# Patient Record
Sex: Female | Born: 1985 | Race: White | Hispanic: No | Smoking: Never smoker
Health system: Southern US, Community
[De-identification: ages and names within clinical notes are randomized; demographics above are authoritative.]

## PROBLEM LIST (undated history)

## (undated) DIAGNOSIS — E119 Type 2 diabetes mellitus without complications: Secondary | ICD-10-CM

## (undated) DIAGNOSIS — D75839 Thrombocytosis, unspecified: Secondary | ICD-10-CM

## (undated) DIAGNOSIS — D473 Essential (hemorrhagic) thrombocythemia: Secondary | ICD-10-CM

## (undated) DIAGNOSIS — I10 Essential (primary) hypertension: Secondary | ICD-10-CM

---
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
## 2019-01-08 DIAGNOSIS — R7989 Other specified abnormal findings of blood chemistry: Secondary | ICD-10-CM

## 2019-02-07 DIAGNOSIS — R7989 Other specified abnormal findings of blood chemistry: Secondary | ICD-10-CM

## 2019-04-30 DIAGNOSIS — U071 COVID-19: Secondary | ICD-10-CM | POA: Diagnosis present

## 2019-05-01 ENCOUNTER — Encounter (HOSPITAL_COMMUNITY): Payer: Self-pay | Admitting: Internal Medicine

## 2019-05-01 ENCOUNTER — Inpatient Hospital Stay (HOSPITAL_COMMUNITY): Payer: Commercial Managed Care - PPO

## 2019-05-01 ENCOUNTER — Other Ambulatory Visit: Payer: Self-pay

## 2019-05-01 ENCOUNTER — Inpatient Hospital Stay (HOSPITAL_COMMUNITY)
Admission: AD | Admit: 2019-05-01 | Discharge: 2019-05-05 | DRG: 177 | Disposition: A | Discharge status: Home / Self Care | Payer: Commercial Managed Care - PPO | Source: Other Acute Inpatient Hospital | Attending: Internal Medicine | Admitting: Internal Medicine

## 2019-05-01 DIAGNOSIS — T380X5A Adverse effect of glucocorticoids and synthetic analogues, initial encounter: Secondary | ICD-10-CM | POA: Diagnosis present

## 2019-05-01 DIAGNOSIS — Z6841 Body Mass Index (BMI) 40.0 and over, adult: Secondary | ICD-10-CM

## 2019-05-01 DIAGNOSIS — J9601 Acute respiratory failure with hypoxia: Secondary | ICD-10-CM | POA: Diagnosis present

## 2019-05-01 DIAGNOSIS — Z833 Family history of diabetes mellitus: Secondary | ICD-10-CM

## 2019-05-01 DIAGNOSIS — E669 Obesity, unspecified: Secondary | ICD-10-CM | POA: Diagnosis present

## 2019-05-01 DIAGNOSIS — D75839 Thrombocytosis, unspecified: Secondary | ICD-10-CM | POA: Diagnosis present

## 2019-05-01 DIAGNOSIS — E119 Type 2 diabetes mellitus without complications: Secondary | ICD-10-CM

## 2019-05-01 DIAGNOSIS — Z8249 Family history of ischemic heart disease and other diseases of the circulatory system: Secondary | ICD-10-CM | POA: Diagnosis not present

## 2019-05-01 DIAGNOSIS — I10 Essential (primary) hypertension: Secondary | ICD-10-CM | POA: Diagnosis present

## 2019-05-01 DIAGNOSIS — U071 COVID-19: Secondary | ICD-10-CM | POA: Diagnosis present

## 2019-05-01 DIAGNOSIS — E1169 Type 2 diabetes mellitus with other specified complication: Secondary | ICD-10-CM | POA: Diagnosis not present

## 2019-05-01 DIAGNOSIS — J1289 Other viral pneumonia: Secondary | ICD-10-CM | POA: Diagnosis present

## 2019-05-01 DIAGNOSIS — Z7982 Long term (current) use of aspirin: Secondary | ICD-10-CM

## 2019-05-01 DIAGNOSIS — R0602 Shortness of breath: Secondary | ICD-10-CM

## 2019-05-01 DIAGNOSIS — D473 Essential (hemorrhagic) thrombocythemia: Secondary | ICD-10-CM | POA: Diagnosis present

## 2019-05-01 DIAGNOSIS — E1165 Type 2 diabetes mellitus with hyperglycemia: Secondary | ICD-10-CM | POA: Diagnosis not present

## 2019-05-01 DIAGNOSIS — Z794 Long term (current) use of insulin: Secondary | ICD-10-CM | POA: Diagnosis not present

## 2019-05-01 HISTORY — DX: Thrombocytosis, unspecified: D75.839

## 2019-05-01 HISTORY — DX: Type 2 diabetes mellitus without complications: E11.9

## 2019-05-01 HISTORY — DX: Essential (primary) hypertension: I10

## 2019-05-01 LAB — COMPREHENSIVE METABOLIC PANEL
ALT: 22 U/L (ref 0–44)
AST: 15 U/L (ref 15–41)
Albumin: 3.4 g/dL — ABNORMAL LOW (ref 3.5–5.0)
Alkaline Phosphatase: 35 U/L — ABNORMAL LOW (ref 38–126)
Anion gap: 15 (ref 5–15)
BUN: 14 mg/dL (ref 6–20)
CO2: 25 mmol/L (ref 22–32)
Calcium: 8.7 mg/dL — ABNORMAL LOW (ref 8.9–10.3)
Chloride: 95 mmol/L — ABNORMAL LOW (ref 98–111)
Creatinine, Ser: 0.74 mg/dL (ref 0.44–1.00)
GFR calc Af Amer: >60 mL/min (ref >60)
GFR calc non Af Amer: >60 mL/min (ref >60)
Glucose, Bld: 318 mg/dL — ABNORMAL HIGH (ref 70–99)
Potassium: 3.8 mmol/L (ref 3.5–5.1)
Sodium: 135 mmol/L (ref 135–145)
Total Bilirubin: 0.7 mg/dL (ref 0.3–1.2)
Total Protein: 7.1 g/dL (ref 6.5–8.1)

## 2019-05-01 LAB — GLUCOSE, CAPILLARY
Glucose-Capillary: 244 mg/dL — ABNORMAL HIGH (ref 70–99)
Glucose-Capillary: 327 mg/dL — ABNORMAL HIGH (ref 70–99)
Glucose-Capillary: 323 mg/dL — ABNORMAL HIGH (ref 70–99)
Glucose-Capillary: 315 mg/dL — ABNORMAL HIGH (ref 70–99)
Glucose-Capillary: 354 mg/dL — ABNORMAL HIGH (ref 70–99)

## 2019-05-01 LAB — CBC WITH DIFFERENTIAL/PLATELET
Abs Immature Granulocytes: 0.03 10*3/uL (ref 0.00–0.07)
Basophils Absolute: 0 10*3/uL (ref 0.0–0.1)
Basophils Relative: 0 %
Eosinophils Absolute: 0 10*3/uL (ref 0.0–0.5)
Eosinophils Relative: 0 %
HCT: 36.1 % (ref 36.0–46.0)
Hemoglobin: 11.4 g/dL — ABNORMAL LOW (ref 12.0–15.0)
Immature Granulocytes: 1 %
Lymphocytes Relative: 14 %
Lymphs Abs: 0.7 10*3/uL (ref 0.7–4.0)
MCH: 26.9 pg (ref 26.0–34.0)
MCHC: 31.6 g/dL (ref 30.0–36.0)
MCV: 85.1 fL (ref 80.0–100.0)
Monocytes Absolute: 0.3 10*3/uL (ref 0.1–1.0)
Monocytes Relative: 6 %
Neutro Abs: 3.6 10*3/uL (ref 1.7–7.7)
Neutrophils Relative %: 79 %
Platelets: 441 10*3/uL — ABNORMAL HIGH (ref 150–400)
RBC: 4.24 MIL/uL (ref 3.87–5.11)
RDW: 12.6 % (ref 11.5–15.5)
WBC: 4.6 10*3/uL (ref 4.0–10.5)
nRBC: 0 % (ref 0.0–0.2)

## 2019-05-01 LAB — C-REACTIVE PROTEIN: CRP: 21.2 mg/dL — ABNORMAL HIGH (ref <1.0)

## 2019-05-01 LAB — D-DIMER, QUANTITATIVE: D-Dimer, Quant: 0.93 ug/mL-FEU — ABNORMAL HIGH (ref 0.00–0.50)

## 2019-05-01 LAB — HEMOGLOBIN A1C
Hgb A1c MFr Bld: 9 % — ABNORMAL HIGH (ref 4.8–5.6)
Mean Plasma Glucose: 211.6 mg/dL

## 2019-05-01 LAB — ABO/RH: ABO/RH(D): O NEG

## 2019-05-01 LAB — HIV ANTIBODY (ROUTINE TESTING W REFLEX): HIV Screen 4th Generation wRfx: NONREACTIVE

## 2019-05-01 LAB — PROCALCITONIN: Procalcitonin: <0.1 ng/mL

## 2019-05-01 LAB — LACTATE DEHYDROGENASE: LDH: 235 U/L — ABNORMAL HIGH (ref 98–192)

## 2019-05-01 MED ORDER — SODIUM CHLORIDE 0.9 % IV SOLN
100.0000 mg | INTRAVENOUS | Status: AC
  Administered 2019-05-02 – 2019-05-05 (×4): 100 mg via INTRAVENOUS
  Filled 2019-05-01 (×4): qty 20

## 2019-05-01 MED ORDER — ENSURE ENLIVE PO LIQD: 237.0000 mL | Freq: Two times a day (BID) | ORAL | Status: DC

## 2019-05-01 MED ORDER — ENOXAPARIN SODIUM 60 MG/0.6ML ~~LOC~~ SOLN
50.0000 mg | SUBCUTANEOUS | Status: DC
  Administered 2019-05-02 – 2019-05-05 (×4): 50 mg via SUBCUTANEOUS
  Filled 2019-05-01 (×4): qty 0.6

## 2019-05-01 MED ORDER — CLONIDINE HCL 0.1 MG PO TABS
0.1000 mg | ORAL_TABLET | Freq: Four times a day (QID) | ORAL | Status: DC | PRN
  Administered 2019-05-01: 0.1 mg via ORAL
  Filled 2019-05-01: qty 1

## 2019-05-01 MED ORDER — SODIUM CHLORIDE 0.9 % IV SOLN
200.0000 mg | Freq: Once | INTRAVENOUS | Status: AC
  Administered 2019-05-01: 200 mg via INTRAVENOUS
  Filled 2019-05-01: qty 40

## 2019-05-01 MED ORDER — ORAL CARE MOUTH RINSE
15.0000 mL | Freq: Two times a day (BID) | OROMUCOSAL | Status: DC
  Administered 2019-05-01 – 2019-05-05 (×8): 15 mL via OROMUCOSAL

## 2019-05-01 MED ORDER — SODIUM CHLORIDE 0.9% FLUSH
3.0000 mL | Freq: Two times a day (BID) | INTRAVENOUS | Status: DC
  Administered 2019-05-01 – 2019-05-05 (×10): 3 mL via INTRAVENOUS

## 2019-05-01 MED ORDER — ACETAMINOPHEN 325 MG PO TABS
650.0000 mg | ORAL_TABLET | Freq: Four times a day (QID) | ORAL | Status: DC | PRN
  Administered 2019-05-01 – 2019-05-04 (×3): 650 mg via ORAL
  Filled 2019-05-01 (×3): qty 2

## 2019-05-01 MED ORDER — ENOXAPARIN SODIUM 40 MG/0.4ML ~~LOC~~ SOLN
40.0000 mg | SUBCUTANEOUS | Status: DC
  Administered 2019-05-01: 40 mg via SUBCUTANEOUS
  Filled 2019-05-01: qty 0.4

## 2019-05-01 MED ORDER — POLYETHYLENE GLYCOL 3350 17 G PO PACK: 17.0000 g | PACK | Freq: Every day | ORAL | Status: DC | PRN

## 2019-05-01 MED ORDER — INSULIN GLARGINE 100 UNIT/ML ~~LOC~~ SOLN
15.0000 [IU] | Freq: Every day | SUBCUTANEOUS | Status: DC
  Administered 2019-05-01: 15 [IU] via SUBCUTANEOUS
  Filled 2019-05-01 (×2): qty 0.15

## 2019-05-01 MED ORDER — METHYLPREDNISOLONE SODIUM SUCC 40 MG IJ SOLR: 40.0000 mg | Freq: Two times a day (BID) | INTRAMUSCULAR | Status: DC

## 2019-05-01 MED ORDER — INSULIN ASPART 100 UNIT/ML ~~LOC~~ SOLN
0.0000 [IU] | Freq: Three times a day (TID) | SUBCUTANEOUS | Status: DC
  Administered 2019-05-01 – 2019-05-02 (×5): 15 [IU] via SUBCUTANEOUS
  Administered 2019-05-02 – 2019-05-03 (×2): 11 [IU] via SUBCUTANEOUS
  Administered 2019-05-03: 20 [IU] via SUBCUTANEOUS
  Administered 2019-05-03: 15 [IU] via SUBCUTANEOUS
  Administered 2019-05-04: 11 [IU] via SUBCUTANEOUS
  Administered 2019-05-04: 20 [IU] via SUBCUTANEOUS
  Administered 2019-05-04: 15 [IU] via SUBCUTANEOUS
  Administered 2019-05-05: 7 [IU] via SUBCUTANEOUS
  Administered 2019-05-05: 4 [IU] via SUBCUTANEOUS

## 2019-05-01 MED ORDER — TRAMADOL HCL 50 MG PO TABS
50.0000 mg | ORAL_TABLET | Freq: Four times a day (QID) | ORAL | Status: DC | PRN
  Administered 2019-05-04 – 2019-05-05 (×2): 50 mg via ORAL
  Filled 2019-05-01 (×2): qty 1

## 2019-05-01 MED ORDER — AMLODIPINE BESYLATE 5 MG PO TABS
5.0000 mg | ORAL_TABLET | Freq: Every day | ORAL | Status: DC
  Administered 2019-05-01 – 2019-05-04 (×4): 5 mg via ORAL
  Filled 2019-05-01 (×5): qty 1

## 2019-05-01 MED ORDER — INSULIN ASPART 100 UNIT/ML ~~LOC~~ SOLN
0.0000 [IU] | Freq: Every day | SUBCUTANEOUS | Status: DC
  Administered 2019-05-01: 5 [IU] via SUBCUTANEOUS
  Administered 2019-05-02 – 2019-05-03 (×2): 3 [IU] via SUBCUTANEOUS
  Administered 2019-05-04: 4 [IU] via SUBCUTANEOUS

## 2019-05-01 MED ORDER — SODIUM CHLORIDE 0.9 % IV SOLN: 250.0000 mL | INTRAVENOUS | Status: DC | PRN

## 2019-05-01 MED ORDER — ONDANSETRON HCL 4 MG PO TABS: 4.0000 mg | ORAL_TABLET | Freq: Four times a day (QID) | ORAL | Status: DC | PRN

## 2019-05-01 MED ORDER — ASPIRIN EC 81 MG PO TBEC
81.0000 mg | DELAYED_RELEASE_TABLET | Freq: Every day | ORAL | Status: DC
  Administered 2019-05-01 – 2019-05-04 (×4): 81 mg via ORAL
  Filled 2019-05-01 (×5): qty 1

## 2019-05-01 MED ORDER — BENZONATATE 100 MG PO CAPS: 200.0000 mg | ORAL_CAPSULE | Freq: Three times a day (TID) | ORAL | Status: DC | PRN

## 2019-05-01 MED ORDER — METHYLPREDNISOLONE SODIUM SUCC 40 MG IJ SOLR
40.0000 mg | Freq: Two times a day (BID) | INTRAMUSCULAR | Status: DC
  Administered 2019-05-01 – 2019-05-04 (×8): 40 mg via INTRAVENOUS
  Filled 2019-05-01 (×8): qty 1

## 2019-05-01 MED ORDER — ALBUTEROL SULFATE HFA 108 (90 BASE) MCG/ACT IN AERS
2.0000 | INHALATION_SPRAY | RESPIRATORY_TRACT | Status: DC | PRN
  Administered 2019-05-01: 2 via RESPIRATORY_TRACT
  Filled 2019-05-01: qty 6.7

## 2019-05-01 MED ORDER — METHYLPREDNISOLONE SODIUM SUCC 500 MG IJ SOLR: 1.0000 mg/kg/d | INTRAMUSCULAR | Status: DC

## 2019-05-01 MED ORDER — ONDANSETRON HCL 4 MG/2ML IJ SOLN: 4.0000 mg | Freq: Four times a day (QID) | INTRAMUSCULAR | Status: DC | PRN

## 2019-05-01 MED ORDER — SODIUM CHLORIDE 0.9% FLUSH: 3.0000 mL | INTRAVENOUS | Status: DC | PRN

## 2019-05-01 NOTE — Assessment & Plan Note (Signed)
Admit to progressive care bed. Continue with supplemental O2.

## 2019-05-01 NOTE — Assessment & Plan Note (Signed)
Continue norvasc. Add prn clonidine. IV steroids may increase her BP.

## 2019-05-01 NOTE — Progress Notes (Signed)
Patient's mother, Juliann Pulse [phone], provided daily updates. Call lasted approximately 15 minutes. Questions encouraged and patient's mother voiced her appreciation of nurses call.

## 2019-05-01 NOTE — Progress Notes (Signed)
PROGRESS NOTE                                                                                                                                                                                                             Patient Demographics:    Courtney Barrera, is a 33 y.o. female, DOB - [birth year], HUD:[id number]  Outpatient Primary MD for the patient is Patient, No Pcp Per    LOS - 0  No chief complaint on file.      Brief Narrative: Patient is a 33 y.o. female with PMHx of HTN, DM-2, obesity-exposed to her boyfriend who was COVID 19+-presented with approximately 10-day history of fever, cough, worsening shortness of breath-found to have acute hypoxemic respiratory failure secondary to COVID-19 viral pneumonia.  See below for further details   Subjective:    Courtney Barrera today feels somewhat better.  Thinks her shortness of breath has improved compared to yesterday.  Still coughing.  No other complaints.   Assessment  & Plan :   Acute Hypoxic Resp Failure due to Covid 19 Viral pneumonia: Somewhat improved-I was able to titrate her oxygen down to around 3.5 L while I was in the room this morning-she maintained O2 saturation in the low 90s.  She appears very comfortable.  At this point plan is to continue with steroids/Remdesivir-and follow inflammatory markers and clinical course.  I have encouraged patient to keep prone for at least patient is s/p Actemra on 7/21 at [facility].  If her oxygen requirements worsen-we could contemplate a second dosing of Actemra and convalescent plasma.  COVID-19 Labs:  Recent Labs    05/01/19 0750  DDIMER 0.93*  LDH 235*  CRP 21.2*    No results found for: SARSCOV2NAA   COVID-19 Medications: 7/21>> Actemra at [facility] 7/21>> Solu-Medrol 7/22>> Remdesivir  Uncontrolled DM-2 with hyperglycemia (A1c 9.0 on 7/22): CBGs more than 300 this morning-anticipate some  amount of hyperglycemia due to steroids-continue SSI-add 15 units of Lantus and follow.  HTN: Controlled-continue amlodipine  ABG: No results found for: PHART, PCO2ART, PO2ART, HCO3, TCO2, ACIDBASEDEF, O2SAT  Condition - Extremely Guarded/Stable  Family Communication  : Patient will update mother and spouse herself.  I have asked her to let me know if I need to talk to her family as well.  Code Status :  Full Code  Diet :  Diet Order            Diet regular Room service appropriate? Yes; Fluid consistency: Thin  Diet effective now               Disposition Plan  :  Remain inpatient  Consults  :  PCCM  Procedures  :  None  DVT Prophylaxis  :  Lovenox   Lab Results  Component Value Date   PLT 441 (H) 05/01/2019    Inpatient Medications  Scheduled Meds: . amLODipine  5 mg Oral Daily  . aspirin EC  81 mg Oral Daily  . [START ON 05/02/2019] enoxaparin (LOVENOX) injection  50 mg Subcutaneous Q24H  . insulin aspart  0-20 Units Subcutaneous TID WC  . insulin aspart  0-5 Units Subcutaneous QHS  . mouth rinse  15 mL Mouth Rinse BID  . methylPREDNISolone (SOLU-MEDROL) injection  40 mg Intravenous BID  . sodium chloride flush  3 mL Intravenous Q12H   Continuous Infusions: . sodium chloride    . [START ON 05/02/2019] remdesivir 100 mg in NS 250 mL     PRN Meds:.sodium chloride, acetaminophen, cloNIDine, ondansetron **OR** ondansetron (ZOFRAN) IV, sodium chloride flush, traMADol  Antibiotics  :    Anti-infectives (From admission, onward)   Start     Dose/Rate Route Frequency Ordered Stop   05/02/19 1030  remdesivir 100 mg in sodium chloride 0.9 % 250 mL IVPB     100 mg 500 mL/hr over 30 Minutes Intravenous Every 24 hours 05/01/19 0924 05/06/19 1029   05/01/19 1030  remdesivir 200 mg in sodium chloride 0.9 % 250 mL IVPB     200 mg 500 mL/hr over 30 Minutes Intravenous Once 05/01/19 0086 05/01/19 1107       Time Spent in minutes  25   Oren Binet M.D on  05/01/2019 at 11:21 AM  To page go to [email or web address] - use universal password  [facility] -  Office  [phone]  See all Orders from today for further details   Admit date - 05/01/2019    0    Objective:   Vitals:   05/01/19 0427 05/01/19 0429 05/01/19 0552 05/01/19 0729  BP:  (!) 127/104  123/88  Pulse:  (!) 104 95 97  Resp:  (!) 27 (!) 21 (!) 27  Temp: 99.1 F (37.3 C)   97.7 F (36.5 C)  TempSrc: Oral   Oral  SpO2:  90% 96% 92%  Weight:      Height:        Wt Readings from Last 3 Encounters:  05/01/19 97.2 kg     Intake/Output Summary (Last 24 hours) at 05/01/2019 1121 Last data filed at 05/01/2019 1037 Gross per 24 hour  Intake 843 ml  Output 975 ml  Net -132 ml     Physical Exam Gen Exam:Alert awake-not in any distress HEENT:atraumatic, normocephalic Chest: B/L clear to auscultation anteriorly CVS:S1S2 regular Abdomen:soft non tender, non distended Extremities:no edema Neurology: Non focal Skin: no rash   Data Review:    CBC Recent Labs  Lab 05/01/19 0750  WBC 4.6  HGB 11.4*  HCT 36.1  PLT 441*  MCV 85.1  MCH 26.9  MCHC 31.6  RDW 12.6  LYMPHSABS 0.7  MONOABS 0.3  EOSABS 0.0  BASOSABS 0.0    Chemistries  Recent Labs  Lab 05/01/19 0750  NA 135  K 3.8  CL 95*  CO2 25  GLUCOSE 318*  BUN 14  CREATININE 0.74  CALCIUM 8.7*  AST 15  ALT 22  ALKPHOS 35*  BILITOT 0.7   ------------------------------------------------------------------------------------------------------------------ No results for input(s): CHOL, HDL, LDLCALC, TRIG, CHOLHDL, LDLDIRECT in the last 72 hours.  Lab Results  Component Value Date   HGBA1C 9.0 (H) 05/01/2019   ------------------------------------------------------------------------------------------------------------------ No results for input(s): TSH, T4TOTAL, T3FREE, THYROIDAB in the last 72 hours.  Invalid input(s): FREET3  ------------------------------------------------------------------------------------------------------------------ No results for input(s): VITAMINB12, FOLATE, FERRITIN, TIBC, IRON, RETICCTPCT in the last 72 hours.  Coagulation profile No results for input(s): INR, PROTIME in the last 168 hours.  Recent Labs    05/01/19 0750  DDIMER 0.93*    Cardiac Enzymes No results for input(s): CKMB, TROPONINI, MYOGLOBIN in the last 168 hours.  Invalid input(s): CK ------------------------------------------------------------------------------------------------------------------ No results found for: BNP  Micro Results No results found for this or any previous visit (from the past 240 hour(s)).  Radiology Reports Portable Chest 1 View  Result Date: 05/01/2019 CLINICAL DATA:  Follow-up viral pneumonia. EXAM: PORTABLE CHEST 1 VIEW COMPARISON:  04/30/2019 FINDINGS: Widespread patchy bilateral pulmonary infiltrates persist, with radiographic improvement. No dense consolidation, collapse or effusion. No worsening or new finding. IMPRESSION: Persistent patchy bilateral pulmonary infiltrates, improved since yesterday. Electronically Signed   By: Nelson Chimes M.D.   On: 05/01/2019 10:03

## 2019-05-01 NOTE — Assessment & Plan Note (Signed)
Pt received Actemra at [facility]. Will continue with Solumedrol at 1 mg/kg daily.  Consider repeat dosing of Actemra is no improvement in 24 hours.  DVT prophylaxis with lovenox.

## 2019-05-01 NOTE — Subjective & Objective (Signed)
CC: fever, chills, positive Covid-95 HPI: 33 year old female with a history of type 2 diabetes, essential hypertension, obesity presents to the [facility] as a transfer from [facility].  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  She has had one encounter with a COVID positive patient.  Her boyfriend also works at [employer] has been in contact with COVID positive patients.  On April 21, 2019, the patient felt like she was having a head cold.  She started having a stuffy nose and a bit of nasal drainage.  3 days later by Wednesday, July 15, she began having nausea, abdominal pain.  She noted taste and smell changes.  She started having fever and chills.  She went to [facility] to have [facility] COVID-19 test performed.  She still not have results today.  She started to feel worse.  Her boyfriend tested positive for COVID.  She went to [facility] ER.  She had a rapid COVID-19 test that was positive.  Her shortness of breath did increase.  She was noted to be hypoxic with O2 saturations less than 90%.  Patient states that for the last 3 days she has been checking her pulse oximetry at home and it has been less than 90%.  Her O2 requirements increased in the ER.  Initially she was on 2 L but quickly increased to 4 L.  She was given a dose of IV Solu-Medrol and a dose of Actemra.  Patient was transferred to [place] for further management.  Patient states that she is feeling better.  Her breathing is still somewhat short but has improved since initiation of therapy.  She is no longer having any fevers.

## 2019-05-01 NOTE — H&P (Signed)
History and Physical    Courtney Barrera VHQ:[id number] DOB: August 03, 1986 DOA: 05/01/2019  PCP: Patient, No Pcp Per   Patient coming from: [facility]  I have personally briefly reviewed patient's old medical records in [place]  CC: fever, chills, positive Covid-76 HPI: 33 year old female with a history of type 2 diabetes, essential hypertension, obesity presents to the [facility] as a transfer from [facility].  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  She has had one encounter with a COVID positive patient.  Her boyfriend also works at [employer] has been in contact with COVID positive patients.  On April 21, 2019, the patient felt like she was having a head cold.  She started having a stuffy nose and a bit of nasal drainage.  3 days later by Wednesday, July 15, she began having nausea, abdominal pain.  She noted taste and smell changes.  She started having fever and chills.  [facility].  She still not have results today.  She started to feel worse.  Her boyfriend tested positive for COVID.  She went to [facility] ER.  She had a rapid COVID-19 test that was positive.  Her shortness of breath did increase.  She was noted to be hypoxic with O2 saturations less than 90%.  Patient states that for the last 3 days she has been checking her pulse oximetry at home and it has been less than 90%.  Her O2 requirements increased in the ER.  Initially she was on 2 L but quickly increased to 4 L.  She was given a dose of IV Solu-Medrol and a dose of Actemra.  Patient was transferred to [facility] for further management.  Patient states that she is feeling better.  Her breathing is still somewhat short but has improved since initiation of therapy.  She is no longer having any fevers.    ED Course: pt given IV solumedrol, Actemra and started on supplemental O2.  Review of  Systems:  Review of Systems  Constitutional: Positive for chills, fever and malaise/fatigue.  HENT:       Taste and smell changes  Eyes: Negative.   Respiratory: Positive for cough and shortness of breath.   Cardiovascular: Negative.   Gastrointestinal: Positive for abdominal pain and diarrhea.  Genitourinary: Negative.   Musculoskeletal: Positive for myalgias.  Skin: Negative.   Neurological: Positive for dizziness.  Endo/Heme/Allergies: Negative.   All other systems reviewed and are negative.   Past Medical History:  Diagnosis Date  . Benign essential HTN   . DM (diabetes mellitus), type 2 ([place])   . Thrombocytosis ([place])     History reviewed. No pertinent surgical history.   reports that she has never smoked. She has never used smokeless tobacco. She reports previous alcohol use. She reports that she does not use drugs.  Allergies  Allergen Reactions  . Codeine Nausea And Vomiting    Family History  Problem Relation Age of Onset  . Diabetes Mother   . Hypertension Mother   . Diabetes Father   . Hypertension Father    Home Meds: asa 81 mg daily Metformin 1000 mg bid norvasc 5 mg daily Trulicity  Physical Exam: Vitals:   05/01/19 0143 05/01/19 0213  BP: 128/87   Pulse: (!) 106   Resp: (!) 22   Temp: 98.8 F (37.1 C)   TempSrc: Oral   SpO2:  91%   Weight:  97.2 kg  Height:  4\' 11"  (1.499 m)    Physical Exam  Nursing note and vitals reviewed. Constitutional: She is oriented to person, place, and time. She appears well-developed and well-nourished. No distress. She is not intubated.  HENT:  Head: Normocephalic and atraumatic.  Nose: Nose normal.  Eyes: Right eye exhibits no discharge. Left eye exhibits no discharge.  Neck: No thyromegaly present.  Cardiovascular: Regular rhythm, S1 normal, S2 normal, normal heart sounds and normal pulses. Tachycardia present. PMI is not displaced.  Respiratory: No accessory muscle usage. No bradypnea. She is not  intubated. No respiratory distress. She has rales in the right upper field and the left upper field.  GI: Soft. Bowel sounds are normal. She exhibits no distension. There is no abdominal tenderness. There is no rebound.  Musculoskeletal:        General: No deformity or edema.  Neurological: She is alert and oriented to person, place, and time.  Skin: Skin is warm and dry. She is not diaphoretic.  Psychiatric: She has a normal mood and affect. Her behavior is normal. Judgment and thought content normal.      Labs on Admission: I have personally reviewed following labs and imaging studies  CBC: No results for input(s): WBC, NEUTROABS, HGB, HCT, MCV, PLT in the last 168 hours. Basic Metabolic Panel: No results for input(s): NA, K, CL, CO2, GLUCOSE, BUN, CREATININE, CALCIUM, MG, PHOS in the last 168 hours. GFR: CrCl cannot be calculated (No successful lab value found.). Liver Function Tests: No results for input(s): AST, ALT, ALKPHOS, BILITOT, PROT, ALBUMIN in the last 168 hours. No results for input(s): LIPASE, AMYLASE in the last 168 hours. No results for input(s): AMMONIA in the last 168 hours. Coagulation Profile: No results for input(s): INR, PROTIME in the last 168 hours. Cardiac Enzymes: No results for input(s): CKTOTAL, CKMB, CKMBINDEX, TROPONINI in the last 168 hours. BNP (last 3 results) No results for input(s): PROBNP in the last 8760 hours. HbA1C: No results for input(s): HGBA1C in the last 72 hours. CBG: No results for input(s): GLUCAP in the last 168 hours. Lipid Profile: No results for input(s): CHOL, HDL, LDLCALC, TRIG, CHOLHDL, LDLDIRECT in the last 72 hours. Thyroid Function Tests: No results for input(s): TSH, T4TOTAL, FREET4, T3FREE, THYROIDAB in the last 72 hours. Anemia Panel: No results for input(s): VITAMINB12, FOLATE, FERRITIN, TIBC, IRON, RETICCTPCT in the last 72 hours. Urine analysis: No results found for: COLORURINE, APPEARANCEUR, LABSPEC, PHURINE,  GLUCOSEU, [place], BILIRUBINUR, KETONESUR, PROTEINUR, UROBILINOGEN, NITRITE, LEUKOCYTESUR  Radiological Exams on Admission: I have personally reviewed images No results found.  EKG:no ekg to review  Assessment/Plan Principal Problem:   Acute respiratory failure with hypoxia (HCC) Active Problems:   COVID-19 virus infection   Thrombocytosis (HCC)   DM (diabetes mellitus), type 2 (HCC)   Benign essential HTN    Acute on chronic respiratory failure with hypoxia ([place]) Admit to progressive care care. Continue with supplemental O2. Repeat labs in AM.  COVID-19 virus infection Pt received Actemra at [facility]. Will continue with Solumedrol at 1 mg/kg daily.  Consider repeat dosing of Actemra is no improvement in 24 hours.  DVT prophylaxis with lovenox.  Acute respiratory failure with hypoxia (HCC) Admit to progressive care bed. Continue with supplemental O2.  Thrombocytosis (HCC) Continue ASA 81 mg daily  DM (diabetes mellitus), type 2 (HCC) Check HgA1C.  Hold metformin and Trulicity while inpatient.  Add sliding scale insulin. May need lantus due to IV  steroids.  Benign essential HTN Continue norvasc. Add prn clonidine. IV steroids may increase her BP.   DVT prophylaxis: Lovenox Code Status: Full Code Family Communication: patient has named her mother Michel Harrow as her healthcare champion  Disposition Plan: to home in 3-5 days  Consults called: none  Admission status: Inpatient, progressive care   Kristopher Oppenheim, DO [facility] 05/01/2019, 2:47 AM

## 2019-05-01 NOTE — Progress Notes (Signed)
Pharmacy Antibiotic Note  Courtney Barrera is a 33 y.o. female admitted on 05/01/2019 with COVID 19.  Pharmacy has been consulted for Remdesivir dosing.  CXR shows PNA at [facility] Pt requiring 5L [place] ALT 22  Plan: Remdesivir 200mg  IV now then 100mg  IV daily x 4 days Will f/u ALT and pt's clinical condition   Height: 4\' 11"  (149.9 cm) Weight: 214 lb 3.2 oz (97.2 kg) IBW/kg (Calculated) : 43.2  Temp (24hrs), Avg:98.5 F (36.9 C), Min:97.7 F (36.5 C), Max:99.1 F (37.3 C)  Recent Labs  Lab 05/01/19 0750  WBC 4.6  CREATININE 0.74    Estimated Creatinine Clearance: 101.4 mL/min (by C-G formula based on SCr of 0.74 mg/dL).    Allergies  Allergen Reactions  . Codeine Nausea And Vomiting    Also reports dizziness    Antimicrobials this admission: 7/22 Remdesivir >> 7/26  Thank you for allowing pharmacy to be a part of this patient's care.  Sherlon Handing, PharmD, BCPS CGV Clinical pharmacist phone [phone] 05/01/2019 9:15 AM

## 2019-05-01 NOTE — Assessment & Plan Note (Signed)
Admit to progressive care care. Continue with supplemental O2. Repeat labs in AM.

## 2019-05-01 NOTE — Progress Notes (Signed)
Spoke with patient's mother, Juliann Pulse, per patient's request and provided updates on patient. She really appreciated the call and the care.

## 2019-05-01 NOTE — Assessment & Plan Note (Signed)
- 

## 2019-05-01 NOTE — Progress Notes (Signed)
Initial Nutrition Assessment  DOCUMENTATION CODES:   Morbid obesity  INTERVENTION:   Pt receiving Magic cup BID with lunch and dinner, each supplement provides 290 kcal and 9 grams of protein, automatically on meal trays to optimize nutritional intake.   Recommend outpatient DM education after discharge.    NUTRITION DIAGNOSIS:   Increased nutrient needs related to acute illness(COVID-19) as evidenced by estimated needs.  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 Her boyfriend works at [employer] he recently tested positive. Pt began to have nausea and abd pain 7/15 and became more SOB 7/22 and was then admitted with COVID.   Pt on 5L [place] Per hemoglobin A1C pt with poor control of her DM as an outpatient.  Meal Completion: 100%  Medications reviewed and include: SSI, solumedrol Labs reviewed:  CBG: 327 (H) Lab Results  Component Value Date   HGBA1C 9.0 (H) 05/01/2019     NUTRITION - FOCUSED PHYSICAL EXAM:  Deferred   Diet Order:   Diet Order            Diet regular Room service appropriate? Yes; Fluid consistency: Thin  Diet effective now              EDUCATION NEEDS:      Skin:  Skin Assessment: Reviewed RN Assessment  Last BM:  7/21  Height:   Ht Readings from Last 1 Encounters:  05/01/19 4\' 11"  (1.499 m)    Weight:   Wt Readings from Last 1 Encounters:  05/01/19 97.2 kg    Ideal Body Weight:  44.6 kg  BMI:  Body mass index is 43.26 kg/m.  Estimated Nutritional Needs:   Kcal:  1700-1900  Protein:  85-100 grams  Fluid:  > 1.7 L/day  Maylon Peppers RD, LDN, CNSC [phone] Pager [phone] After Hours Pager

## 2019-05-01 NOTE — Assessment & Plan Note (Signed)
Check HgA1C.  Hold metformin and Trulicity while inpatient.  Add sliding scale insulin. May need lantus due to IV steroids.

## 2019-05-02 DIAGNOSIS — I10 Essential (primary) hypertension: Secondary | ICD-10-CM

## 2019-05-02 DIAGNOSIS — E1169 Type 2 diabetes mellitus with other specified complication: Secondary | ICD-10-CM

## 2019-05-02 DIAGNOSIS — Z794 Long term (current) use of insulin: Secondary | ICD-10-CM

## 2019-05-02 DIAGNOSIS — J1289 Other viral pneumonia: Secondary | ICD-10-CM

## 2019-05-02 DIAGNOSIS — U071 COVID-19: Principal | ICD-10-CM

## 2019-05-02 DIAGNOSIS — J9601 Acute respiratory failure with hypoxia: Secondary | ICD-10-CM

## 2019-05-02 LAB — COMPREHENSIVE METABOLIC PANEL
ALT: 17 U/L (ref 0–44)
AST: 13 U/L — ABNORMAL LOW (ref 15–41)
Albumin: 3.5 g/dL (ref 3.5–5.0)
Alkaline Phosphatase: 36 U/L — ABNORMAL LOW (ref 38–126)
Anion gap: 11 (ref 5–15)
BUN: 21 mg/dL — ABNORMAL HIGH (ref 6–20)
CO2: 28 mmol/L (ref 22–32)
Calcium: 9 mg/dL (ref 8.9–10.3)
Chloride: 97 mmol/L — ABNORMAL LOW (ref 98–111)
Creatinine, Ser: 0.7 mg/dL (ref 0.44–1.00)
GFR calc Af Amer: >60 mL/min (ref >60)
GFR calc non Af Amer: >60 mL/min (ref >60)
Glucose, Bld: 289 mg/dL — ABNORMAL HIGH (ref 70–99)
Potassium: 4 mmol/L (ref 3.5–5.1)
Sodium: 136 mmol/L (ref 135–145)
Total Bilirubin: 0.6 mg/dL (ref 0.3–1.2)
Total Protein: 6.9 g/dL (ref 6.5–8.1)

## 2019-05-02 LAB — CBC
HCT: 35.3 % — ABNORMAL LOW (ref 36.0–46.0)
Hemoglobin: 10.9 g/dL — ABNORMAL LOW (ref 12.0–15.0)
MCH: 26.9 pg (ref 26.0–34.0)
MCHC: 30.9 g/dL (ref 30.0–36.0)
MCV: 87.2 fL (ref 80.0–100.0)
Platelets: 514 10*3/uL — ABNORMAL HIGH (ref 150–400)
RBC: 4.05 MIL/uL (ref 3.87–5.11)
RDW: 12.8 % (ref 11.5–15.5)
WBC: 10.7 10*3/uL — ABNORMAL HIGH (ref 4.0–10.5)
nRBC: 0 % (ref 0.0–0.2)

## 2019-05-02 LAB — GLUCOSE, CAPILLARY
Glucose-Capillary: 311 mg/dL — ABNORMAL HIGH (ref 70–99)
Glucose-Capillary: 325 mg/dL — ABNORMAL HIGH (ref 70–99)
Glucose-Capillary: 264 mg/dL — ABNORMAL HIGH (ref 70–99)
Glucose-Capillary: 300 mg/dL — ABNORMAL HIGH (ref 70–99)

## 2019-05-02 LAB — D-DIMER, QUANTITATIVE: D-Dimer, Quant: 0.6 ug/mL-FEU — ABNORMAL HIGH (ref 0.00–0.50)

## 2019-05-02 LAB — FERRITIN: Ferritin: 332 ng/mL — ABNORMAL HIGH (ref 11–307)

## 2019-05-02 LAB — C-REACTIVE PROTEIN: CRP: 10.5 mg/dL — ABNORMAL HIGH (ref <1.0)

## 2019-05-02 LAB — BRAIN NATRIURETIC PEPTIDE: B Natriuretic Peptide: 37.4 pg/mL (ref 0.0–100.0)

## 2019-05-02 MED ORDER — INSULIN ASPART 100 UNIT/ML ~~LOC~~ SOLN
4.0000 [IU] | Freq: Three times a day (TID) | SUBCUTANEOUS | Status: DC
  Administered 2019-05-02 – 2019-05-03 (×4): 4 [IU] via SUBCUTANEOUS

## 2019-05-02 MED ORDER — INSULIN GLARGINE 100 UNIT/ML ~~LOC~~ SOLN
25.0000 [IU] | Freq: Every day | SUBCUTANEOUS | Status: DC
  Administered 2019-05-02 – 2019-05-03 (×2): 25 [IU] via SUBCUTANEOUS
  Filled 2019-05-02 (×2): qty 0.25

## 2019-05-02 NOTE — Progress Notes (Signed)
PROGRESS NOTE                                                                                                                                                                                                             Patient Demographics:    Courtney Barrera, is a 33 y.o. female, DOB - 08/09/1986, FYB:[id number]  Outpatient Primary MD for the patient is Patient, No Pcp Per    LOS - 1  No chief complaint on file.      Brief Narrative: Patient is a 33 y.o. female with PMHx of HTN, DM-2, obesity-exposed to her boyfriend who was COVID 19+-presented with approximately 10-day history of fever, cough, worsening shortness of breath-found to have acute hypoxemic respiratory failure secondary to COVID-19 viral pneumonia.  See below for further details   Subjective:    Dexter Sauser today feels much better-has been titrated down to room air this morning.   Assessment  & Plan :   Acute Hypoxic Resp Failure due to Covid 19 Viral pneumonia: Improved-has been titrated down to room air this morning.  Claims that when she ambulates she has significantly less shortness of breath.  Continue steroids and Remdesivir-she is s/p Actemra on 7/21 at [place]:  Recent Labs    05/01/19 0750 05/02/19 0145  DDIMER 0.93* 0.60*  FERRITIN  --  332*  LDH 235*  --   CRP 21.2* 10.5*    No results found for: SARSCOV2NAA   COVID-19 Medications: 7/21>> Actemra at [facility] 7/21>> Solu-Medrol 7/22>> Remdesivir  Uncontrolled DM-2 with hyperglycemia (A1c 9.0 on 7/22): CBGs still significantly elevated-increase Lantus to 25 units daily, add NovoLog 4 units with meals-continue resistantCBGs more than 300 this morning-anticipate some amount of hyperglycemia due to steroids-continue SSI-add 15 units of Lantus and follow.  HTN: Controlled-continue amlodipine  ABG: No results found for: PHART, PCO2ART, PO2ART,  HCO3, TCO2, ACIDBASEDEF, O2SAT  Condition - Extremely Guarded/Stable  Family Communication  : Patient will update mother and spouse herself.  I have asked her to let me know if I need to talk to her family as well.  Code Status :  Full Code  Diet :  Diet Order            Diet Carb Modified Fluid consistency: Thin; Room service appropriate? Yes  Diet effective now  Disposition Plan  :  Remain inpatient-home likely on 7/26  Consults  :  PCCM  Procedures  :  None   DVT Prophylaxis  :  Lovenox   Lab Results  Component Value Date   PLT 514 (H) 05/02/2019    Inpatient Medications  Scheduled Meds: . amLODipine  5 mg Oral Daily  . aspirin EC  81 mg Oral Daily  . enoxaparin (LOVENOX) injection  50 mg Subcutaneous Q24H  . insulin aspart  0-20 Units Subcutaneous TID WC  . insulin aspart  0-5 Units Subcutaneous QHS  . insulin aspart  4 Units Subcutaneous TID WC  . insulin glargine  25 Units Subcutaneous Daily  . mouth rinse  15 mL Mouth Rinse BID  . methylPREDNISolone (SOLU-MEDROL) injection  40 mg Intravenous BID  . sodium chloride flush  3 mL Intravenous Q12H   Continuous Infusions: . sodium chloride    . remdesivir 100 mg in NS 250 mL 100 mg (05/02/19 1027)   PRN Meds:.sodium chloride, acetaminophen, albuterol, benzonatate, cloNIDine, ondansetron **OR** ondansetron (ZOFRAN) IV, polyethylene glycol, sodium chloride flush, traMADol  Antibiotics  :    Anti-infectives (From admission, onward)   Start     Dose/Rate Route Frequency Ordered Stop   05/02/19 1030  remdesivir 100 mg in sodium chloride 0.9 % 250 mL IVPB     100 mg 500 mL/hr over 30 Minutes Intravenous Every 24 hours 05/01/19 0924 05/06/19 1029   05/01/19 1030  remdesivir 200 mg in sodium chloride 0.9 % 250 mL IVPB     200 mg 500 mL/hr over 30 Minutes Intravenous Once 05/01/19 0924 05/01/19 1120       Time Spent in minutes  25   Oren Binet M.D on 05/02/2019 at 11:01 AM  To page go to  [email or web address] - use universal password  [facility] -  Office  [phone]  See all Orders from today for further details   Admit date - 05/01/2019    1    Objective:   Vitals:   05/01/19 2019 05/01/19 2305 05/02/19 0347 05/02/19 0400  BP: 112/81 110/75 104/75   Pulse:  90  88  Resp:  (!) 23  17  Temp: 97.8 F (36.6 C) 98 F (36.7 C) 98.3 F (36.8 C)   TempSrc: Axillary Oral Oral   SpO2:  95%  97%  Weight:      Height:        Wt Readings from Last 3 Encounters:  05/01/19 97.2 kg     Intake/Output Summary (Last 24 hours) at 05/02/2019 1101 Last data filed at 05/02/2019 0350 Gross per 24 hour  Intake 493 ml  Output 1350 ml  Net -857 ml     Physical Exam Physical Exam Gen Exam:Alert awake-not in any distress HEENT:atraumatic, normocephalic Chest: B/L clear to auscultation anteriorly CVS:S1S2 regular Abdomen:soft non tender, non distended Extremities:no edema Neurology: Non focal Skin: no rash   Data Review:    CBC Recent Labs  Lab 05/01/19 0750 05/02/19 0145  WBC 4.6 10.7*  HGB 11.4* 10.9*  HCT 36.1 35.3*  PLT 441* 514*  MCV 85.1 87.2  MCH 26.9 26.9  MCHC 31.6 30.9  RDW 12.6 12.8  LYMPHSABS 0.7  --   MONOABS 0.3  --   EOSABS 0.0  --   BASOSABS 0.0  --     Chemistries  Recent Labs  Lab 05/01/19 0750 05/02/19 0145  NA 135 136  K 3.8 4.0  CL 95* 97*  CO2 25 28  GLUCOSE 318* 289*  BUN 14 21*  CREATININE 0.74 0.70  CALCIUM 8.7* 9.0  AST 15 13*  ALT 22 17  ALKPHOS 35* 36*  BILITOT 0.7 0.6   ------------------------------------------------------------------------------------------------------------------ No results for input(s): CHOL, HDL, LDLCALC, TRIG, CHOLHDL, LDLDIRECT in the last 72 hours.  Lab Results  Component Value Date   HGBA1C 9.0 (H) 05/01/2019   ------------------------------------------------------------------------------------------------------------------ No results for input(s): TSH, T4TOTAL, T3FREE,  THYROIDAB in the last 72 hours.  Invalid input(s): FREET3 ------------------------------------------------------------------------------------------------------------------ Recent Labs    05/02/19 0145  FERRITIN 332*    Coagulation profile No results for input(s): INR, PROTIME in the last 168 hours.  Recent Labs    05/01/19 0750 05/02/19 0145  DDIMER 0.93* 0.60*    Cardiac Enzymes No results for input(s): CKMB, TROPONINI, MYOGLOBIN in the last 168 hours.  Invalid input(s): CK ------------------------------------------------------------------------------------------------------------------    Component Value Date/Time   BNP 37.4 05/02/2019 0145    Micro Results No results found for this or any previous visit (from the past 240 hour(s)).  Radiology Reports Portable Chest 1 View  Result Date: 05/01/2019 CLINICAL DATA:  Follow-up viral pneumonia. EXAM: PORTABLE CHEST 1 VIEW COMPARISON:  04/30/2019 FINDINGS: Widespread patchy bilateral pulmonary infiltrates persist, with radiographic improvement. No dense consolidation, collapse or effusion. No worsening or new finding. IMPRESSION: Persistent patchy bilateral pulmonary infiltrates, improved since yesterday. Electronically Signed   By: Nelson Chimes M.D.   On: 05/01/2019 10:03

## 2019-05-02 NOTE — Progress Notes (Signed)
Spoke with patient's Mom, Juliann Pulse, per patient request for update. She appreciated the call and the care.

## 2019-05-03 LAB — C-REACTIVE PROTEIN: CRP: 5.1 mg/dL — ABNORMAL HIGH (ref <1.0)

## 2019-05-03 LAB — CBC
HCT: 35.7 % — ABNORMAL LOW (ref 36.0–46.0)
Hemoglobin: 11.1 g/dL — ABNORMAL LOW (ref 12.0–15.0)
MCH: 26.7 pg (ref 26.0–34.0)
MCHC: 31.1 g/dL (ref 30.0–36.0)
MCV: 85.8 fL (ref 80.0–100.0)
Platelets: 627 10*3/uL — ABNORMAL HIGH (ref 150–400)
RBC: 4.16 MIL/uL (ref 3.87–5.11)
RDW: 12.7 % (ref 11.5–15.5)
WBC: 14.4 10*3/uL — ABNORMAL HIGH (ref 4.0–10.5)
nRBC: 0 % (ref 0.0–0.2)

## 2019-05-03 LAB — COMPREHENSIVE METABOLIC PANEL
ALT: 16 U/L (ref 0–44)
AST: 13 U/L — ABNORMAL LOW (ref 15–41)
Albumin: 3.4 g/dL — ABNORMAL LOW (ref 3.5–5.0)
Alkaline Phosphatase: 43 U/L (ref 38–126)
Anion gap: 11 (ref 5–15)
BUN: 26 mg/dL — ABNORMAL HIGH (ref 6–20)
CO2: 27 mmol/L (ref 22–32)
Calcium: 8.7 mg/dL — ABNORMAL LOW (ref 8.9–10.3)
Chloride: 97 mmol/L — ABNORMAL LOW (ref 98–111)
Creatinine, Ser: 0.75 mg/dL (ref 0.44–1.00)
GFR calc Af Amer: >60 mL/min (ref >60)
GFR calc non Af Amer: >60 mL/min (ref >60)
Glucose, Bld: 306 mg/dL — ABNORMAL HIGH (ref 70–99)
Potassium: 4 mmol/L (ref 3.5–5.1)
Sodium: 135 mmol/L (ref 135–145)
Total Bilirubin: 0.6 mg/dL (ref 0.3–1.2)
Total Protein: 6.7 g/dL (ref 6.5–8.1)

## 2019-05-03 LAB — D-DIMER, QUANTITATIVE: D-Dimer, Quant: 0.58 ug/mL-FEU — ABNORMAL HIGH (ref 0.00–0.50)

## 2019-05-03 LAB — GLUCOSE, CAPILLARY
Glucose-Capillary: 290 mg/dL — ABNORMAL HIGH (ref 70–99)
Glucose-Capillary: 324 mg/dL — ABNORMAL HIGH (ref 70–99)
Glucose-Capillary: 364 mg/dL — ABNORMAL HIGH (ref 70–99)
Glucose-Capillary: 272 mg/dL — ABNORMAL HIGH (ref 70–99)

## 2019-05-03 LAB — FERRITIN: Ferritin: 249 ng/mL (ref 11–307)

## 2019-05-03 LAB — BRAIN NATRIURETIC PEPTIDE: B Natriuretic Peptide: 84.3 pg/mL (ref 0.0–100.0)

## 2019-05-03 MED ORDER — INSULIN GLARGINE 100 UNIT/ML ~~LOC~~ SOLN
42.0000 [IU] | Freq: Every day | SUBCUTANEOUS | Status: DC
  Administered 2019-05-04 – 2019-05-05 (×2): 42 [IU] via SUBCUTANEOUS
  Filled 2019-05-03 (×2): qty 0.42

## 2019-05-03 MED ORDER — INSULIN ASPART 100 UNIT/ML ~~LOC~~ SOLN
6.0000 [IU] | Freq: Three times a day (TID) | SUBCUTANEOUS | Status: DC
  Administered 2019-05-03 – 2019-05-05 (×7): 6 [IU] via SUBCUTANEOUS

## 2019-05-03 NOTE — Progress Notes (Signed)
PROGRESS NOTE                                                                                                                                                                                                             Patient Demographics:    Courtney Barrera, is a 33 y.o. female, DOB - [birth year], HUD:[id number]  Outpatient Primary MD for the patient is Patient, No Pcp Per    LOS - 2  No chief complaint on file.      Brief Narrative: Patient is a 33 y.o. female with PMHx of HTN, DM-2, obesity-exposed to her boyfriend who was COVID 19+-presented with approximately 10-day history of fever, cough, worsening shortness of breath-found to have acute hypoxemic respiratory failure secondary to COVID-19 viral pneumonia.  See below for further details   Subjective:    Courtney Barrera today continues to feel better-ambulating in the room when I walked in this morning.  Claims that she was mostly on room air-but at times feels better with just 1-2 L of oxygen.   Assessment  & Plan :   Acute Hypoxic Resp Failure due to Covid 19 Viral pneumonia: Continues to improve-minimal to no oxygen requirement (only uses oxygen for comfort at times).continue steroids and Remdesivir.  She is s/p Actemra on 7/21.   COVID-19 Labs:  Recent Labs    05/01/19 0750 05/02/19 0145 05/03/19 0200  DDIMER 0.93* 0.60* 0.58*  FERRITIN  --  332* 249  LDH 235*  --   --   CRP 21.2* 10.5* 5.1*    No results found for: SARSCOV2NAA   COVID-19 Medications: 7/21>> Actemra at [facility] 7/21>> Solu-Medrol 7/22>> Remdesivir  Uncontrolled DM-2 with hyperglycemia (A1c 9.0 on 7/22): CBGs still significantly elevated-increase Lantus to 42 units (first dose will be given on 7/25) increase pre-meal NovoLog to 6 units-continue with resistant scale SSI.  Follow.    HTN: Controlled-continue amlodipine  ABG: No results found for: PHART, PCO2ART, PO2ART,  HCO3, TCO2, ACIDBASEDEF, O2SAT  Condition - Stable  Family Communication  : Patient will update mother and spouse herself.  I have asked her to let me know if I need to talk to her family as well.  Code Status :  Full Code  Diet :  Diet Order            Diet Carb Modified Fluid  consistency: Thin; Room service appropriate? Yes  Diet effective now               Disposition Plan  :  Remain inpatient-home likely on 7/26  Consults  :  PCCM  Procedures  :  None   DVT Prophylaxis  :  Lovenox   Lab Results  Component Value Date   PLT 627 (H) 05/03/2019    Inpatient Medications  Scheduled Meds: . amLODipine  5 mg Oral Daily  . aspirin EC  81 mg Oral Daily  . enoxaparin (LOVENOX) injection  50 mg Subcutaneous Q24H  . insulin aspart  0-20 Units Subcutaneous TID WC  . insulin aspart  0-5 Units Subcutaneous QHS  . insulin aspart  4 Units Subcutaneous TID WC  . insulin glargine  25 Units Subcutaneous Daily  . mouth rinse  15 mL Mouth Rinse BID  . methylPREDNISolone (SOLU-MEDROL) injection  40 mg Intravenous BID  . sodium chloride flush  3 mL Intravenous Q12H   Continuous Infusions: . sodium chloride    . remdesivir 100 mg in NS 250 mL Stopped (05/02/19 1101)   PRN Meds:.sodium chloride, acetaminophen, albuterol, benzonatate, cloNIDine, ondansetron **OR** ondansetron (ZOFRAN) IV, polyethylene glycol, sodium chloride flush, traMADol  Antibiotics  :    Anti-infectives (From admission, onward)   Start     Dose/Rate Route Frequency Ordered Stop   05/02/19 1030  remdesivir 100 mg in sodium chloride 0.9 % 250 mL IVPB     100 mg 500 mL/hr over 30 Minutes Intravenous Every 24 hours 05/01/19 0924 05/06/19 1029   05/01/19 1030  remdesivir 200 mg in sodium chloride 0.9 % 250 mL IVPB     200 mg 500 mL/hr over 30 Minutes Intravenous Once 05/01/19 7341 05/01/19 1120       Time Spent in minutes  25   Oren Binet M.D on 05/03/2019 at 10:46 AM  To page go to [email or web address] -  use universal password  [facility] -  Office  [phone]  See all Orders from today for further details   Admit date - 05/01/2019    2    Objective:   Vitals:   05/02/19 1935 05/03/19 0054 05/03/19 0451 05/03/19 0736  BP: 124/87 124/85 122/88 119/86  Pulse: 85 86 85 88  Resp: 20 18 18 18   Temp: 98.7 F (37.1 C) 98.3 F (36.8 C) 98.2 F (36.8 C) 98.2 F (36.8 C)  TempSrc: Oral Oral Oral Oral  SpO2: 97% 95% 93% 96%  Weight:      Height:        Wt Readings from Last 3 Encounters:  05/01/19 97.2 kg     Intake/Output Summary (Last 24 hours) at 05/03/2019 1046 Last data filed at 05/03/2019 0458 Gross per 24 hour  Intake 253 ml  Output 1200 ml  Net -947 ml     Physical Exam Gen Exam:Alert awake-not in any distress HEENT:atraumatic, normocephalic Chest: B/L clear to auscultation anteriorly CVS:S1S2 regular Abdomen:soft non tender, non distended Extremities:no edema Neurology: Non focal Skin: no rash  Data Review:    CBC Recent Labs  Lab 05/01/19 0750 05/02/19 0145 05/03/19 0200  WBC 4.6 10.7* 14.4*  HGB 11.4* 10.9* 11.1*  HCT 36.1 35.3* 35.7*  PLT 441* 514* 627*  MCV 85.1 87.2 85.8  MCH 26.9 26.9 26.7  MCHC 31.6 30.9 31.1  RDW 12.6 12.8 12.7  LYMPHSABS 0.7  --   --   MONOABS 0.3  --   --   EOSABS 0.0  --   --  BASOSABS 0.0  --   --     Chemistries  Recent Labs  Lab 05/01/19 0750 05/02/19 0145 05/03/19 0200  NA 135 136 135  K 3.8 4.0 4.0  CL 95* 97* 97*  CO2 25 28 27   GLUCOSE 318* 289* 306*  BUN 14 21* 26*  CREATININE 0.74 0.70 0.75  CALCIUM 8.7* 9.0 8.7*  AST 15 13* 13*  ALT 22 17 16   ALKPHOS 35* 36* 43  BILITOT 0.7 0.6 0.6   ------------------------------------------------------------------------------------------------------------------ No results for input(s): CHOL, HDL, LDLCALC, TRIG, CHOLHDL, LDLDIRECT in the last 72 hours.  Lab Results  Component Value Date   HGBA1C 9.0 (H) 05/01/2019    ------------------------------------------------------------------------------------------------------------------ No results for input(s): TSH, T4TOTAL, T3FREE, THYROIDAB in the last 72 hours.  Invalid input(s): FREET3 ------------------------------------------------------------------------------------------------------------------ Recent Labs    05/02/19 0145 05/03/19 0200  FERRITIN 332* 249    Coagulation profile No results for input(s): INR, PROTIME in the last 168 hours.  Recent Labs    05/02/19 0145 05/03/19 0200  DDIMER 0.60* 0.58*    Cardiac Enzymes No results for input(s): CKMB, TROPONINI, MYOGLOBIN in the last 168 hours.  Invalid input(s): CK ------------------------------------------------------------------------------------------------------------------    Component Value Date/Time   BNP 84.3 05/03/2019 0200    Micro Results No results found for this or any previous visit (from the past 240 hour(s)).  Radiology Reports Portable Chest 1 View  Result Date: 05/01/2019 CLINICAL DATA:  Follow-up viral pneumonia. EXAM: PORTABLE CHEST 1 VIEW COMPARISON:  04/30/2019 FINDINGS: Widespread patchy bilateral pulmonary infiltrates persist, with radiographic improvement. No dense consolidation, collapse or effusion. No worsening or new finding. IMPRESSION: Persistent patchy bilateral pulmonary infiltrates, improved since yesterday. Electronically Signed   By: Nelson Chimes M.D.   On: 05/01/2019 10:03

## 2019-05-04 LAB — COMPREHENSIVE METABOLIC PANEL
ALT: 19 U/L (ref 0–44)
AST: 14 U/L — ABNORMAL LOW (ref 15–41)
Albumin: 3.5 g/dL (ref 3.5–5.0)
Alkaline Phosphatase: 51 U/L (ref 38–126)
Anion gap: 7 (ref 5–15)
BUN: 21 mg/dL — ABNORMAL HIGH (ref 6–20)
CO2: 30 mmol/L (ref 22–32)
Calcium: 9 mg/dL (ref 8.9–10.3)
Chloride: 99 mmol/L (ref 98–111)
Creatinine, Ser: 0.64 mg/dL (ref 0.44–1.00)
GFR calc Af Amer: >60 mL/min (ref >60)
GFR calc non Af Amer: >60 mL/min (ref >60)
Glucose, Bld: 259 mg/dL — ABNORMAL HIGH (ref 70–99)
Potassium: 4 mmol/L (ref 3.5–5.1)
Sodium: 136 mmol/L (ref 135–145)
Total Bilirubin: 0.4 mg/dL (ref 0.3–1.2)
Total Protein: 6.8 g/dL (ref 6.5–8.1)

## 2019-05-04 LAB — CBC
HCT: 37.2 % (ref 36.0–46.0)
Hemoglobin: 12.2 g/dL (ref 12.0–15.0)
MCH: 27.2 pg (ref 26.0–34.0)
MCHC: 32.8 g/dL (ref 30.0–36.0)
MCV: 83 fL (ref 80.0–100.0)
Platelets: 621 10*3/uL — ABNORMAL HIGH (ref 150–400)
RBC: 4.48 MIL/uL (ref 3.87–5.11)
RDW: 12.3 % (ref 11.5–15.5)
WBC: 12 10*3/uL — ABNORMAL HIGH (ref 4.0–10.5)
nRBC: 0.2 % (ref 0.0–0.2)

## 2019-05-04 LAB — GLUCOSE, CAPILLARY
Glucose-Capillary: 286 mg/dL — ABNORMAL HIGH (ref 70–99)
Glucose-Capillary: 381 mg/dL — ABNORMAL HIGH (ref 70–99)
Glucose-Capillary: 322 mg/dL — ABNORMAL HIGH (ref 70–99)
Glucose-Capillary: 392 mg/dL — ABNORMAL HIGH (ref 70–99)

## 2019-05-04 LAB — FERRITIN: Ferritin: 200 ng/mL (ref 11–307)

## 2019-05-04 LAB — D-DIMER, QUANTITATIVE: D-Dimer, Quant: 0.64 ug/mL-FEU — ABNORMAL HIGH (ref 0.00–0.50)

## 2019-05-04 LAB — C-REACTIVE PROTEIN: CRP: 2.6 mg/dL — ABNORMAL HIGH (ref <1.0)

## 2019-05-04 MED ORDER — DEXAMETHASONE 6 MG PO TABS
6.0000 mg | ORAL_TABLET | Freq: Every day | ORAL | Status: DC
  Administered 2019-05-05: 6 mg via ORAL
  Filled 2019-05-04: qty 1

## 2019-05-04 NOTE — Progress Notes (Signed)
PROGRESS NOTE                                                                                                                                                                                                             Patient Demographics:    Courtney Barrera, is a 33 y.o. female, DOB - September 06, 1986, SWH:[id number]  Outpatient Primary MD for the patient is Patient, No Pcp Per    LOS - 3  No chief complaint on file.      Brief Narrative: Patient is a 33 y.o. female with PMHx of HTN, DM-2, obesity-exposed to her boyfriend who was COVID 19+-presented with approximately 10-day history of fever, cough, worsening shortness of breath-found to have acute hypoxemic respiratory failure secondary to COVID-19 viral pneumonia.  See below for further details   Subjective:    Courtney Barrera feels much better-on room air-and wants to go home.   Assessment  & Plan :   Acute Hypoxic Resp Failure due to Covid 19 Viral pneumonia: Continues to improve-on room air-continue steroids-last day of Remdesivir on 7/26.  She is s/p Actemra on 7/21.  Suspect that if clinical improvement continues-she could potentially go home after she completes course of Remdesivir on 7/26.  Will switch from Solu-Medrol to oral Decadron today.  COVID-19 Labs:  Recent Labs    05/02/19 0145 05/03/19 0200 05/04/19 0215  DDIMER 0.60* 0.58* 0.64*  FERRITIN 332* 249 200  CRP 10.5* 5.1* 2.6*    No results found for: SARSCOV2NAA   COVID-19 Medications: 7/21>> Actemra at [facility] 7/21>> Solu-Medrol 7/22>> Remdesivir  Uncontrolled DM-2 with hyperglycemia (A1c 9.0 on 7/22): CBG still elevated-since steroids are being tapered down-we will continue with current dosing of Lantus, pre-meal NovoLog and SSI.  Continue to follow.      HTN: Controlled-continue amlodipine  ABG: No results found for: PHART, PCO2ART, PO2ART, HCO3, TCO2, ACIDBASEDEF, O2SAT   Condition - Stable  Family Communication  : Patient will update mother and spouse herself.  I have asked her to let me know if I need to talk to her family as well.  Code Status :  Full Code  Diet :  Diet Order            Diet Carb Modified Fluid consistency: Thin; Room service appropriate? Yes  Diet effective now  Disposition Plan  :  Remain inpatient-home likely on 7/26  Consults  :  PCCM  Procedures  :  None   DVT Prophylaxis  :  Lovenox   Lab Results  Component Value Date   PLT 621 (H) 05/04/2019    Inpatient Medications  Scheduled Meds: . amLODipine  5 mg Oral Daily  . aspirin EC  81 mg Oral Daily  . enoxaparin (LOVENOX) injection  50 mg Subcutaneous Q24H  . insulin aspart  0-20 Units Subcutaneous TID WC  . insulin aspart  0-5 Units Subcutaneous QHS  . insulin aspart  6 Units Subcutaneous TID WC  . insulin glargine  42 Units Subcutaneous Daily  . mouth rinse  15 mL Mouth Rinse BID  . methylPREDNISolone (SOLU-MEDROL) injection  40 mg Intravenous BID  . sodium chloride flush  3 mL Intravenous Q12H   Continuous Infusions: . sodium chloride    . remdesivir 100 mg in NS 250 mL 100 mg (05/04/19 1111)   PRN Meds:.sodium chloride, acetaminophen, albuterol, benzonatate, cloNIDine, ondansetron **OR** ondansetron (ZOFRAN) IV, polyethylene glycol, sodium chloride flush, traMADol  Antibiotics  :    Anti-infectives (From admission, onward)   Start     Dose/Rate Route Frequency Ordered Stop   05/02/19 1030  remdesivir 100 mg in sodium chloride 0.9 % 250 mL IVPB     100 mg 500 mL/hr over 30 Minutes Intravenous Every 24 hours 05/01/19 0924 05/06/19 1029   05/01/19 1030  remdesivir 200 mg in sodium chloride 0.9 % 250 mL IVPB     200 mg 500 mL/hr over 30 Minutes Intravenous Once 05/01/19 1610 05/01/19 1120       Time Spent in minutes  25   Oren Binet M.D on 05/04/2019 at 12:15 PM  To page go to [email or web address] - use universal password  [facility] -  Office  [phone]  See all Orders from today for further details   Admit date - 05/01/2019    3    Objective:   Vitals:   05/04/19 0000 05/04/19 0413 05/04/19 0415 05/04/19 0802  BP:   109/82   Pulse: 76  72   Resp:  16  18  Temp:  98 F (36.7 C)  98 F (36.7 C)  TempSrc:  Oral  Oral  SpO2: 94%  95%   Weight:      Height:        Wt Readings from Last 3 Encounters:  05/01/19 97.2 kg     Intake/Output Summary (Last 24 hours) at 05/04/2019 1215 Last data filed at 05/03/2019 2220 Gross per 24 hour  Intake 243 ml  Output 600 ml  Net -357 ml     Physical Exam Gen Exam:Alert awake-not in any distress HEENT:atraumatic, normocephalic Chest: B/L clear to auscultation anteriorly CVS:S1S2 regular Abdomen:soft non tender, non distended Extremities:no edema Neurology: Non focal Skin: no rash   Data Review:    CBC Recent Labs  Lab 05/01/19 0750 05/02/19 0145 05/03/19 0200 05/04/19 0215  WBC 4.6 10.7* 14.4* 12.0*  HGB 11.4* 10.9* 11.1* 12.2  HCT 36.1 35.3* 35.7* 37.2  PLT 441* 514* 627* 621*  MCV 85.1 87.2 85.8 83.0  MCH 26.9 26.9 26.7 27.2  MCHC 31.6 30.9 31.1 32.8  RDW 12.6 12.8 12.7 12.3  LYMPHSABS 0.7  --   --   --   MONOABS 0.3  --   --   --   EOSABS 0.0  --   --   --   BASOSABS 0.0  --   --   --  Chemistries  Recent Labs  Lab 05/01/19 0750 05/02/19 0145 05/03/19 0200 05/04/19 0215  NA 135 136 135 136  K 3.8 4.0 4.0 4.0  CL 95* 97* 97* 99  CO2 25 28 27 30   GLUCOSE 318* 289* 306* 259*  BUN 14 21* 26* 21*  CREATININE 0.74 0.70 0.75 0.64  CALCIUM 8.7* 9.0 8.7* 9.0  AST 15 13* 13* 14*  ALT 22 17 16 19   ALKPHOS 35* 36* 43 51  BILITOT 0.7 0.6 0.6 0.4   ------------------------------------------------------------------------------------------------------------------ No results for input(s): CHOL, HDL, LDLCALC, TRIG, CHOLHDL, LDLDIRECT in the last 72 hours.  Lab Results  Component Value Date   HGBA1C 9.0 (H) 05/01/2019    ------------------------------------------------------------------------------------------------------------------ No results for input(s): TSH, T4TOTAL, T3FREE, THYROIDAB in the last 72 hours.  Invalid input(s): FREET3 ------------------------------------------------------------------------------------------------------------------ Recent Labs    05/03/19 0200 05/04/19 0215  FERRITIN 249 200    Coagulation profile No results for input(s): INR, PROTIME in the last 168 hours.  Recent Labs    05/03/19 0200 05/04/19 0215  DDIMER 0.58* 0.64*    Cardiac Enzymes No results for input(s): CKMB, TROPONINI, MYOGLOBIN in the last 168 hours.  Invalid input(s): CK ------------------------------------------------------------------------------------------------------------------    Component Value Date/Time   BNP 84.3 05/03/2019 0200    Micro Results No results found for this or any previous visit (from the past 240 hour(s)).  Radiology Reports Portable Chest 1 View  Result Date: 05/01/2019 CLINICAL DATA:  Follow-up viral pneumonia. EXAM: PORTABLE CHEST 1 VIEW COMPARISON:  04/30/2019 FINDINGS: Widespread patchy bilateral pulmonary infiltrates persist, with radiographic improvement. No dense consolidation, collapse or effusion. No worsening or new finding. IMPRESSION: Persistent patchy bilateral pulmonary infiltrates, improved since yesterday. Electronically Signed   By: Nelson Chimes M.D.   On: 05/01/2019 10:03

## 2019-05-04 NOTE — Progress Notes (Signed)
Patient deferred family update phone call. 

## 2019-05-05 LAB — COMPREHENSIVE METABOLIC PANEL
ALT: 22 U/L (ref 0–44)
AST: 9 U/L — ABNORMAL LOW (ref 15–41)
Albumin: 3.5 g/dL (ref 3.5–5.0)
Alkaline Phosphatase: 49 U/L (ref 38–126)
Anion gap: 10 (ref 5–15)
BUN: 25 mg/dL — ABNORMAL HIGH (ref 6–20)
CO2: 28 mmol/L (ref 22–32)
Calcium: 8.8 mg/dL — ABNORMAL LOW (ref 8.9–10.3)
Chloride: 97 mmol/L — ABNORMAL LOW (ref 98–111)
Creatinine, Ser: 0.71 mg/dL (ref 0.44–1.00)
GFR calc Af Amer: >60 mL/min (ref >60)
GFR calc non Af Amer: >60 mL/min (ref >60)
Glucose, Bld: 277 mg/dL — ABNORMAL HIGH (ref 70–99)
Potassium: 3.6 mmol/L (ref 3.5–5.1)
Sodium: 135 mmol/L (ref 135–145)
Total Bilirubin: 0.5 mg/dL (ref 0.3–1.2)
Total Protein: 6.6 g/dL (ref 6.5–8.1)

## 2019-05-05 LAB — CBC
HCT: 38.7 % (ref 36.0–46.0)
Hemoglobin: 12.6 g/dL (ref 12.0–15.0)
MCH: 26.9 pg (ref 26.0–34.0)
MCHC: 32.6 g/dL (ref 30.0–36.0)
MCV: 82.7 fL (ref 80.0–100.0)
Platelets: 728 10*3/uL — ABNORMAL HIGH (ref 150–400)
RBC: 4.68 MIL/uL (ref 3.87–5.11)
RDW: 12.3 % (ref 11.5–15.5)
WBC: 14.3 10*3/uL — ABNORMAL HIGH (ref 4.0–10.5)
nRBC: 0.3 % — ABNORMAL HIGH (ref 0.0–0.2)

## 2019-05-05 LAB — GLUCOSE, CAPILLARY
Glucose-Capillary: 189 mg/dL — ABNORMAL HIGH (ref 70–99)
Glucose-Capillary: 240 mg/dL — ABNORMAL HIGH (ref 70–99)

## 2019-05-05 LAB — FERRITIN: Ferritin: 163 ng/mL (ref 11–307)

## 2019-05-05 LAB — D-DIMER, QUANTITATIVE: D-Dimer, Quant: 0.41 ug/mL-FEU (ref 0.00–0.50)

## 2019-05-05 LAB — C-REACTIVE PROTEIN: CRP: 1.9 mg/dL — ABNORMAL HIGH (ref <1.0)

## 2019-05-05 MED ORDER — DEXAMETHASONE 6 MG PO TABS: 6.0000 mg | ORAL_TABLET | Freq: Every day | ORAL | 0 refills | Status: DC

## 2019-05-05 MED ORDER — IBUPROFEN 800 MG PO TABS
800.0000 mg | ORAL_TABLET | Freq: Once | ORAL | Status: AC
  Administered 2019-05-05: 800 mg via ORAL
  Filled 2019-05-05: qty 1

## 2019-05-05 MED ORDER — ACETAMINOPHEN 500 MG PO TABS
1000.0000 mg | ORAL_TABLET | Freq: Once | ORAL | Status: AC
  Administered 2019-05-05: 1000 mg via ORAL
  Filled 2019-05-05: qty 2

## 2019-05-05 NOTE — Discharge Instructions (Signed)
Person Under Monitoring Name: Courtney Barrera  Location: [place] [address]   Infection Prevention Recommendations for Individuals Confirmed to have, or Being Evaluated for, 2019 Novel Coronavirus (COVID-19) Infection Who Receive Care at Home  Individuals who are confirmed to have, or are being evaluated for, COVID-19 should follow the prevention steps below until a healthcare provider or local or state health department says they can return to normal activities.  Stay home except to get medical care You should restrict activities outside your home, except for getting medical care. Do not go to work, school, or public areas, and do not use public transportation or taxis.  Call ahead before visiting your doctor Before your medical appointment, call the healthcare provider and tell them that you have, or are being evaluated for, COVID-19 infection. This will help the healthcare providers office take steps to keep other people from getting infected. Ask your healthcare provider to call the local or state health department.  Monitor your symptoms Seek prompt medical attention if your illness is worsening (e.g., difficulty breathing). Before going to your medical appointment, call the healthcare provider and tell them that you have, or are being evaluated for, COVID-19 infection. Ask your healthcare provider to call the local or state health department.  Wear a facemask You should wear a facemask that covers your nose and mouth when you are in the same room with other people and when you visit a healthcare provider. People who live with or visit you should also wear a facemask while they are in the same room with you.  Separate yourself from other people in your home As much as possible, you should stay in a different room from other people in your home. Also, you should use a separate bathroom, if available.  Avoid sharing household items You should  not share dishes, drinking glasses, cups, eating utensils, towels, bedding, or other items with other people in your home. After using these items, you should wash them thoroughly with soap and water.  Cover your coughs and sneezes Cover your mouth and nose with a tissue when you cough or sneeze, or you can cough or sneeze into your sleeve. Throw used tissues in a lined trash can, and immediately wash your hands with soap and water for at least 20 seconds or use an alcohol-based hand rub.  Wash your [facility] your hands often and thoroughly with soap and water for at least 20 seconds. You can use an alcohol-based hand sanitizer if soap and water are not available and if your hands are not visibly dirty. Avoid touching your eyes, nose, and mouth with unwashed hands.   Prevention Steps for Caregivers and Household Members of Individuals Confirmed to have, or Being Evaluated for, COVID-19 Infection Being Cared for in the Home  If you live with, or provide care at home for, a person confirmed to have, or being evaluated for, COVID-19 infection please follow these guidelines to prevent infection:  Follow healthcare providers instructions Make sure that you understand and can help the patient follow any healthcare provider instructions for all care.  Provide for the patients basic needs You should help the patient with basic needs in the home and provide support for getting groceries, prescriptions, and other personal needs.  Monitor the patients symptoms If they are getting sicker, call his or her medical provider and tell them that the patient has, or is being evaluated for, COVID-19 infection. This will help the healthcare  providers office take steps to keep other people from getting infected. Ask the healthcare provider to call the local or state health department.  Limit the number of people who have contact with the patient  If possible, have only one caregiver for the  patient.  Other household members should stay in another home or place of residence. If this is not possible, they should stay  in another room, or be separated from the patient as much as possible. Use a separate bathroom, if available.  Restrict visitors who do not have an essential need to be in the home.  Keep older adults, very young children, and other sick people away from the patient Keep older adults, very young children, and those who have compromised immune systems or chronic health conditions away from the patient. This includes people with chronic heart, lung, or kidney conditions, diabetes, and cancer.  Ensure good ventilation Make sure that shared spaces in the home have good air flow, such as from an air conditioner or an opened window, weather permitting.  Wash your hands often  Wash your hands often and thoroughly with soap and water for at least 20 seconds. You can use an alcohol based hand sanitizer if soap and water are not available and if your hands are not visibly dirty.  Avoid touching your eyes, nose, and mouth with unwashed hands.  Use disposable paper towels to dry your hands. If not available, use dedicated cloth towels and replace them when they become wet.  Wear a facemask and gloves  Wear a disposable facemask at all times in the room and gloves when you touch or have contact with the patients blood, body fluids, and/or secretions or excretions, such as sweat, saliva, sputum, nasal mucus, vomit, urine, or feces.  Ensure the mask fits over your nose and mouth tightly, and do not touch it during use.  Throw out disposable facemasks and gloves after using them. Do not reuse.  Wash your hands immediately after removing your facemask and gloves.  If your personal clothing becomes contaminated, carefully remove clothing and launder. Wash your hands after handling contaminated clothing.  Place all used disposable facemasks, gloves, and other waste in a lined  container before disposing them with other household waste.  Remove gloves and wash your hands immediately after handling these items.  Do not share dishes, glasses, or other household items with the patient  Avoid sharing household items. You should not share dishes, drinking glasses, cups, eating utensils, towels, bedding, or other items with a patient who is confirmed to have, or being evaluated for, COVID-19 infection.  After the person uses these items, you should wash them thoroughly with soap and water.  Wash laundry thoroughly  Immediately remove and wash clothes or bedding that have blood, body fluids, and/or secretions or excretions, such as sweat, saliva, sputum, nasal mucus, vomit, urine, or feces, on them.  Wear gloves when handling laundry from the patient.  Read and follow directions on labels of laundry or clothing items and detergent. In general, wash and dry with the warmest temperatures recommended on the label.  Clean all areas the individual has used often  Clean all touchable surfaces, such as counters, tabletops, doorknobs, bathroom fixtures, toilets, phones, keyboards, tablets, and bedside tables, every day. Also, clean any surfaces that may have blood, body fluids, and/or secretions or excretions on them.  Wear gloves when cleaning surfaces the patient has come in contact with.  Use a diluted bleach solution (e.g., dilute bleach with  1 part bleach and 10 parts water) or a household disinfectant with a label that says EPA-registered for coronaviruses. To make a bleach solution at home, add 1 tablespoon of bleach to 1 quart (4 cups) of water. For a larger supply, add  cup of bleach to 1 gallon (16 cups) of water.  Read labels of cleaning products and follow recommendations provided on product labels. Labels contain instructions for safe and effective use of the cleaning product including precautions you should take when applying the product, such as wearing gloves or  eye protection and making sure you have good ventilation during use of the product.  Remove gloves and wash hands immediately after cleaning.  Monitor yourself for signs and symptoms of illness Caregivers and household members are considered close contacts, should monitor their health, and will be asked to limit movement outside of the home to the extent possible. Follow the monitoring steps for close contacts listed on the symptom monitoring form.   ? If you have additional questions, contact your local health department or call the epidemiologist on call at [phone] (available 24/7). ? This guidance is subject to change. For the most up-to-date guidance from [facility], please refer to their website: [email or web address]

## 2019-05-05 NOTE — Discharge Summary (Signed)
PATIENT DETAILS Name: Courtney Barrera Age: 33 y.o. Sex: female Date of Birth: [birth year] MRN: [id number]. Admitting Physician: Little Ishikawa, MD [id number], No Pcp Per  Admit Date: 05/01/2019 Discharge date: 05/05/2019  Recommendations for Outpatient Follow-up:  1. Follow up with PCP in 1-2 weeks 2. Please obtain BMP/CBC in one week   Admitted From:  Home  Disposition: [place]: No  Equipment/Devices: None  Discharge Condition: Stable  CODE STATUS: FULL CODE  Diet recommendation:   Diet Order            Diet - low sodium heart healthy        Diet Carb Modified        Diet Carb Modified Fluid consistency: Thin; Room service appropriate? Yes  Diet effective now               Brief Summary: See H&P, Labs, Consult and Test reports for all details in brief, Patient is a 33 y.o. female with PMHx of HTN, DM-2, obesity-exposed to her boyfriend who was COVID 19+-presented with approximately 10-day history of fever, cough, worsening shortness of breath-found to have acute hypoxemic respiratory failure secondary to COVID-19 viral pneumonia.  See below for further details  Brief Hospital Course: Acute Hypoxic Resp Failure due to Covid 19 Viral pneumonia:  Markedly better after treatment with Actemra x1, steroids and 5-day course of Remdesivir.  Has been on room air for the past few days.  Clinically improved with no shortness of breath or cough.  Will complete a course of Remdesivir today-following which she will be discharged home given marked clinical improvement.  COVID-19 Labs  Recent Labs    05/03/19 0200 05/04/19 0215 05/05/19 0323  DDIMER 0.58* 0.64* 0.41  FERRITIN 249 200 163  CRP 5.1* 2.6* 1.9*     COVID-19 Medications: 7/21>> Actemra at [facility] 7/21>> Solu-Medrol 7/22>> Remdesivir  Uncontrolled DM-2 with hyperglycemia (A1c 9.0 on 7/22):  CBGs were elevated during this hospital stay as patient was on  steroids-but they have been tapered down and CBGs are now slowly coming down.  Was on Lantus and SSI during this hospital stay.  Patient is not keen on being on insulin on discharge-she will resume her usual diabetic regimen on discharge and follow-up with PCP for further optimization.  HTN: Controlled-continue amlodipine  Obesity: Estimated body mass index is 43.26 kg/m as calculated from the following:   Height as of this encounter: 4\' 11"  (1.499 m).   Weight as of this encounter: 97.2 kg.    Procedures/Studies: None  Discharge Diagnoses:  Principal Problem:   Acute respiratory failure with hypoxia (HCC) Active Problems:   COVID-19 virus infection   Thrombocytosis (HCC)   DM (diabetes mellitus), type 2 ([place])   Benign essential HTN   Discharge Instructions:  Activity:  As tolerated  Discharge Instructions    Call MD for:  difficulty breathing, headache or visual disturbances   Complete by: As directed    Call MD for:  persistant nausea and vomiting   Complete by: As directed    Call MD for:  temperature >100.4   Complete by: As directed    Diet - low sodium heart healthy   Complete by: As directed    Diet Carb Modified   Complete by: As directed    Discharge instructions   Complete by: As directed    Follow with Primary MD in 1 week  Please get a complete blood count and chemistry panel checked by  your Primary MD at your next visit, and again as instructed by your Primary MD.  Get Medicines reviewed and adjusted: Please take all your medications with you for your next visit with your Primary MD  Laboratory/radiological data: Please request your Primary MD to go over all hospital tests and procedure/radiological results at the follow up, please ask your Primary MD to get all Hospital records sent to his/her office.  In some cases, they will be blood work, cultures and biopsy results pending at the time of your discharge. Please request that your primary care M.D.  follows up on these results.  Also Note the following: If you experience worsening of your admission symptoms, develop shortness of breath, life threatening emergency, suicidal or homicidal thoughts you must seek medical attention immediately by calling 911 or calling your MD immediately  if symptoms less severe.  You must read complete instructions/literature along with all the possible adverse reactions/side effects for all the Medicines you take and that have been prescribed to you. Take any new Medicines after you have completely understood and accpet all the possible adverse reactions/side effects.   Do not drive when taking Pain medications or sleeping medications (Benzodaizepines)  Do not take more than prescribed Pain, Sleep and Anxiety Medications. It is not advisable to combine anxiety,sleep and pain medications without talking with your primary care practitioner  Special Instructions: If you have smoked or chewed Tobacco  in the last 2 yrs please stop smoking, stop any regular Alcohol  and or any Recreational drug use.  Wear Seat belts while driving.  Please note: You were cared for by a hospitalist during your hospital stay. Once you are discharged, your primary care physician will handle any further medical issues. Please note that NO REFILLS for any discharge medications will be authorized once you are discharged, as it is imperative that you return to your primary care physician (or establish a relationship with a primary care physician if you do not have one) for your post hospital discharge needs so that they can reassess your need for medications and monitor your lab values.   ?   Person Under Monitoring Name: Aviyanna Colbaugh Eckman  Location: [place] [address]   Infection Prevention Recommendations for Individuals Confirmed to have, or Being Evaluated for, 2019 Novel Coronavirus (COVID-19) Infection Who Receive Care at Home  Individuals who are  confirmed to have, or are being evaluated for, COVID-19 should follow the prevention steps below until a healthcare provider or local or state health department says they can return to normal activities.  Stay home except to get medical care You should restrict activities outside your home, except for getting medical care. Do not go to work, school, or public areas, and do not use public transportation or taxis.  Call ahead before visiting your doctor Before your medical appointment, call the healthcare provider and tell them that you have, or are being evaluated for, COVID-19 infection. This will help the healthcare providers office take steps to keep other people from getting infected. Ask your healthcare provider to call the local or state health department.  Monitor your symptoms Seek prompt medical attention if your illness is worsening (e.g., difficulty breathing). Before going to your medical appointment, call the healthcare provider and tell them that you have, or are being evaluated for, COVID-19 infection. Ask your healthcare provider to call the local or state health department.  Wear a facemask You should wear a facemask that covers your nose  and mouth when you are in the same room with other people and when you visit a healthcare provider. People who live with or visit you should also wear a facemask while they are in the same room with you.  Separate yourself from other people in your home As much as possible, you should stay in a different room from other people in your home. Also, you should use a separate bathroom, if available.  Avoid sharing household items You should not share dishes, drinking glasses, cups, eating utensils, towels, bedding, or other items with other people in your home. After using these items, you should wash them thoroughly with soap and water.  Cover your coughs and sneezes Cover your mouth and nose with a tissue when you cough or sneeze, or  you can cough or sneeze into your sleeve. Throw used tissues in a lined trash can, and immediately wash your hands with soap and water for at least 20 seconds or use an alcohol-based hand rub.  Wash your [facility] your hands often and thoroughly with soap and water for at least 20 seconds. You can use an alcohol-based hand sanitizer if soap and water are not available and if your hands are not visibly dirty. Avoid touching your eyes, nose, and mouth with unwashed hands.   Prevention Steps for Caregivers and Household Members of Individuals Confirmed to have, or Being Evaluated for, COVID-19 Infection Being Cared for in the Home  If you live with, or provide care at home for, a person confirmed to have, or being evaluated for, COVID-19 infection please follow these guidelines to prevent infection:  Follow healthcare providers instructions Make sure that you understand and can help the patient follow any healthcare provider instructions for all care.  Provide for the patients basic needs You should help the patient with basic needs in the home and provide support for getting groceries, prescriptions, and other personal needs.  Monitor the patients symptoms If they are getting sicker, call his or her medical provider and tell them that the patient has, or is being evaluated for, COVID-19 infection. This will help the healthcare providers office take steps to keep other people from getting infected. Ask the healthcare provider to call the local or state health department.  Limit the number of people who have contact with the patient If possible, have only one caregiver for the patient. Other household members should stay in another home or place of residence. If this is not possible, they should stay in another room, or be separated from the patient as much as possible. Use a separate bathroom, if available. Restrict visitors who do not have an essential need to be in the  home.  Keep older adults, very young children, and other sick people away from the patient Keep older adults, very young children, and those who have compromised immune systems or chronic health conditions away from the patient. This includes people with chronic heart, lung, or kidney conditions, diabetes, and cancer.  Ensure good ventilation Make sure that shared spaces in the home have good air flow, such as from an air conditioner or an opened window, weather permitting.  Wash your hands often Wash your hands often and thoroughly with soap and water for at least 20 seconds. You can use an alcohol based hand sanitizer if soap and water are not available and if your hands are not visibly dirty. Avoid touching your eyes, nose, and mouth with unwashed hands. Use disposable paper towels to dry your hands.  If not available, use dedicated cloth towels and replace them when they become wet.  Wear a facemask and gloves Wear a disposable facemask at all times in the room and gloves when you touch or have contact with the patients blood, body fluids, and/or secretions or excretions, such as sweat, saliva, sputum, nasal mucus, vomit, urine, or feces.  Ensure the mask fits over your nose and mouth tightly, and do not touch it during use. Throw out disposable facemasks and gloves after using them. Do not reuse. Wash your hands immediately after removing your facemask and gloves. If your personal clothing becomes contaminated, carefully remove clothing and launder. Wash your hands after handling contaminated clothing. Place all used disposable facemasks, gloves, and other waste in a lined container before disposing them with other household waste. Remove gloves and wash your hands immediately after handling these items.  Do not share dishes, glasses, or other household items with the patient Avoid sharing household items. You should not share dishes, drinking glasses, cups, eating utensils, towels,  bedding, or other items with a patient who is confirmed to have, or being evaluated for, COVID-19 infection. After the person uses these items, you should wash them thoroughly with soap and water.  Wash laundry thoroughly Immediately remove and wash clothes or bedding that have blood, body fluids, and/or secretions or excretions, such as sweat, saliva, sputum, nasal mucus, vomit, urine, or feces, on them. Wear gloves when handling laundry from the patient. Read and follow directions on labels of laundry or clothing items and detergent. In general, wash and dry with the warmest temperatures recommended on the label.  Clean all areas the individual has used often Clean all touchable surfaces, such as counters, tabletops, doorknobs, bathroom fixtures, toilets, phones, keyboards, tablets, and bedside tables, every day. Also, clean any surfaces that may have blood, body fluids, and/or secretions or excretions on them. Wear gloves when cleaning surfaces the patient has come in contact with. Use a diluted bleach solution (e.g., dilute bleach with 1 part bleach and 10 parts water) or a household disinfectant with a label that says EPA-registered for coronaviruses. To make a bleach solution at home, add 1 tablespoon of bleach to 1 quart (4 cups) of water. For a larger supply, add  cup of bleach to 1 gallon (16 cups) of water. Read labels of cleaning products and follow recommendations provided on product labels. Labels contain instructions for safe and effective use of the cleaning product including precautions you should take when applying the product, such as wearing gloves or eye protection and making sure you have good ventilation during use of the product. Remove gloves and wash hands immediately after cleaning.  Monitor yourself for signs and symptoms of illness Caregivers and household members are considered close contacts, should monitor their health, and will be asked to limit movement outside of  the home to the extent possible. Follow the monitoring steps for close contacts listed on the symptom monitoring form.   ? If you have additional questions, contact your local health department or call the epidemiologist on call at [phone] (available 24/7). ? This guidance is subject to change. For the most up-to-date guidance from [facility], please refer to their website: YouBlogs.pl   Increase activity slowly   Complete by: As directed      Allergies as of 05/05/2019      Reactions   Codeine Nausea And Vomiting   Also reports dizziness      Medication List    TAKE these medications  amLODipine 5 MG tablet Commonly known as: NORVASC Take 5 mg by mouth at bedtime.   aspirin EC 81 MG tablet Take 81 mg by mouth at bedtime.   dexamethasone 6 MG tablet Commonly known as: DECADRON Take 1 tablet (6 mg total) by mouth daily. Start taking on: May 06, 2019   esomeprazole 20 MG capsule Commonly known as: NEXIUM Take 20 mg by mouth at bedtime.   metFORMIN 1000 MG (MOD) 24 hr tablet Commonly known as: GLUMETZA Take 1,000 mg by mouth daily with breakfast.   Trulicity 5.88 FO/2.7XA Sopn Generic drug: Dulaglutide Inject 0.75 mg into the skin once a week. Takes on Mondays      Follow-up Information    Primary MD. Schedule an appointment as soon as possible for a visit in 1 week(s).          Allergies  Allergen Reactions   Codeine Nausea And Vomiting    Also reports dizziness    Consultations:   None  Other Procedures/Studies: Portable Chest 1 View  Result Date: 05/01/2019 CLINICAL DATA:  Follow-up viral pneumonia. EXAM: PORTABLE CHEST 1 VIEW COMPARISON:  04/30/2019 FINDINGS: Widespread patchy bilateral pulmonary infiltrates persist, with radiographic improvement. No dense consolidation, collapse or effusion. No worsening or new finding. IMPRESSION: Persistent patchy bilateral pulmonary infiltrates,  improved since yesterday. Electronically Signed   By: Nelson Chimes M.D.   On: 05/01/2019 10:03      TODAY-DAY OF DISCHARGE:  Subjective:   Chandra Batch today has no headache,no chest abdominal pain,no new weakness tingling or numbness, feels much better wants to go home today.   Objective:   Blood pressure 109/82, pulse 72, temperature 97.8 F (36.6 C), temperature source Oral, resp. rate 18, height 4\' 11"  (1.499 m), weight 97.2 kg, SpO2 94 %.  Intake/Output Summary (Last 24 hours) at 05/05/2019 0914 Last data filed at 05/05/2019 0200 Gross per 24 hour  Intake 600 ml  Output --  Net 600 ml   Filed Weights   05/01/19 0213  Weight: 97.2 kg    Exam: Awake Alert, Oriented *3, No new F.N deficits, Normal affect [place].AT,PERRAL Supple Neck,No JVD, No cervical lymphadenopathy appriciated.  Symmetrical Chest wall movement, Good air movement bilaterally, CTAB RRR,No Gallops,Rubs or new Murmurs, No Parasternal Heave +ve B.Sounds, Abd Soft, Non tender, No organomegaly appriciated, No rebound -guarding or rigidity. No Cyanosis, Clubbing or edema, No new Rash or bruise   PERTINENT RADIOLOGIC STUDIES: Portable Chest 1 View  Result Date: 05/01/2019 CLINICAL DATA:  Follow-up viral pneumonia. EXAM: PORTABLE CHEST 1 VIEW COMPARISON:  04/30/2019 FINDINGS: Widespread patchy bilateral pulmonary infiltrates persist, with radiographic improvement. No dense consolidation, collapse or effusion. No worsening or new finding. IMPRESSION: Persistent patchy bilateral pulmonary infiltrates, improved since yesterday. Electronically Signed   By: Nelson Chimes M.D.   On: 05/01/2019 10:03     PERTINENT LAB RESULTS: CBC: Recent Labs    05/04/19 0215 05/05/19 0323  WBC 12.0* 14.3*  HGB 12.2 12.6  HCT 37.2 38.7  PLT 621* 728*   CMET CMP     Component Value Date/Time   NA 135 05/05/2019 0323   K 3.6 05/05/2019 0323   CL 97 (L) 05/05/2019 0323   CO2 28 05/05/2019 0323   GLUCOSE 277 (H)  05/05/2019 0323   BUN 25 (H) 05/05/2019 0323   CREATININE 0.71 05/05/2019 0323   CALCIUM 8.8 (L) 05/05/2019 0323   PROT 6.6 05/05/2019 0323   ALBUMIN 3.5 05/05/2019 0323   AST 9 (L) 05/05/2019 1287  ALT 22 05/05/2019 0323   ALKPHOS 49 05/05/2019 0323   BILITOT 0.5 05/05/2019 0323   GFRNONAA >60 05/05/2019 0323   GFRAA >60 05/05/2019 0323    GFR Estimated Creatinine Clearance: 101.4 mL/min (by C-G formula based on SCr of 0.71 mg/dL). No results for input(s): LIPASE, AMYLASE in the last 72 hours. No results for input(s): CKTOTAL, CKMB, CKMBINDEX, TROPONINI in the last 72 hours. Invalid input(s): POCBNP Recent Labs    05/04/19 0215 05/05/19 0323  DDIMER 0.64* 0.41   No results for input(s): HGBA1C in the last 72 hours. No results for input(s): CHOL, HDL, LDLCALC, TRIG, CHOLHDL, LDLDIRECT in the last 72 hours. No results for input(s): TSH, T4TOTAL, T3FREE, THYROIDAB in the last 72 hours.  Invalid input(s): FREET3 Recent Labs    05/04/19 0215 05/05/19 0323  FERRITIN 200 163   Coags: No results for input(s): INR in the last 72 hours.  Invalid input(s): PT Microbiology: No results found for this or any previous visit (from the past 240 hour(s)).  FURTHER DISCHARGE INSTRUCTIONS:  Get Medicines reviewed and adjusted: Please take all your medications with you for your next visit with your Primary MD  Laboratory/radiological data: Please request your Primary MD to go over all hospital tests and procedure/radiological results at the follow up, please ask your Primary MD to get all Hospital records sent to his/her office.  In some cases, they will be blood work, cultures and biopsy results pending at the time of your discharge. Please request that your primary care M.D. goes through all the records of your hospital data and follows up on these results.  Also Note the following: If you experience worsening of your admission symptoms, develop shortness of breath, life  threatening emergency, suicidal or homicidal thoughts you must seek medical attention immediately by calling 911 or calling your MD immediately  if symptoms less severe.  You must read complete instructions/literature along with all the possible adverse reactions/side effects for all the Medicines you take and that have been prescribed to you. Take any new Medicines after you have completely understood and accpet all the possible adverse reactions/side effects.   Do not drive when taking Pain medications or sleeping medications (Benzodaizepines)  Do not take more than prescribed Pain, Sleep and Anxiety Medications. It is not advisable to combine anxiety,sleep and pain medications without talking with your primary care practitioner  Special Instructions: If you have smoked or chewed Tobacco  in the last 2 yrs please stop smoking, stop any regular Alcohol  and or any Recreational drug use.  Wear Seat belts while driving.  Please note: You were cared for by a hospitalist during your hospital stay. Once you are discharged, your primary care physician will handle any further medical issues. Please note that NO REFILLS for any discharge medications will be authorized once you are discharged, as it is imperative that you return to your primary care physician (or establish a relationship with a primary care physician if you do not have one) for your post hospital discharge needs so that they can reassess your need for medications and monitor your lab values.  Total Time spent coordinating discharge including counseling, education and face to face time equals 25  minutes.  SignedOren Binet 05/05/2019 9:14 AM

## 2019-05-06 NOTE — Care Management (Signed)
05/06/19  1620  Case manager contacted patient to inform her of scheduled Hospital followup tele visit scheduled for Wednesday, 05/15/19 at 2:30pm. With [facility] provider.  Case manager also asked patient how she is doing since discharge. She states she is sating in the 90's, just has no energy, but she does feel better. Patient appreciated the call.     Ricki Miller, RN BSN  Case Manager [phone]

## 2019-05-15 ENCOUNTER — Inpatient Hospital Stay: Payer: Commercial Managed Care - PPO | Admitting: Nurse Practitioner

## 2020-02-10 DIAGNOSIS — D473 Essential (hemorrhagic) thrombocythemia: Secondary | ICD-10-CM | POA: Diagnosis not present

## 2020-08-04 ENCOUNTER — Other Ambulatory Visit: Payer: Self-pay | Admitting: Hematology and Oncology

## 2020-08-04 DIAGNOSIS — D75839 Thrombocytosis, unspecified: Secondary | ICD-10-CM

## 2020-08-11 ENCOUNTER — Other Ambulatory Visit: Payer: Self-pay | Admitting: Oncology

## 2020-08-11 DIAGNOSIS — D75839 Thrombocytosis, unspecified: Secondary | ICD-10-CM

## 2020-08-11 NOTE — Progress Notes (Addendum)
[facility]  [address] [place],  [place]  [address] [phone]  Clinic Day:  08/12/2020  HISTORY OF PRESENT ILLNESS:  The patient is a 34 y.o. 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  In the past, JAK2 mutation testing was done, which came back negative for a potential underlying myeloproliferative disorder being present.  She comes in today to reassess her platelets.  Since her last visit, the patient has been doing fairly well.  She denies having any underlying clotting complications as it pertains to her elevated platelet count.     PHYSICAL EXAM:  Blood pressure (!) 135/93, pulse 100, temperature 98.5 F (36.9 C), resp. rate 16, height 5' (1.524 m), weight 210 lb 12.8 oz (95.6 kg), SpO2 96 %. Wt Readings from Last 3 Encounters:  08/12/20 210 lb 12.8 oz (95.6 kg)  05/01/19 214 lb 3.2 oz (97.2 kg)   Body mass index is 41.17 kg/m. Performance status (ECOG): 0 Physical Exam Constitutional:      Appearance: Normal appearance.  HENT:     Mouth/Throat:     Pharynx: Oropharynx is clear. No oropharyngeal exudate.  Cardiovascular:     Rate and Rhythm: Normal rate and regular rhythm.     Heart sounds: No murmur heard.  No friction rub. No gallop.   Pulmonary:     Breath sounds: Normal breath sounds.  Abdominal:     General: Bowel sounds are normal. There is no distension.     Palpations: Abdomen is soft. There is no mass.     Tenderness: There is no abdominal tenderness.  Musculoskeletal:        General: No tenderness.     Cervical back: Normal range of motion and neck supple.     Right lower leg: No edema.     Left lower leg: No edema.  Lymphadenopathy:     Cervical: No cervical adenopathy.     Right cervical: No superficial, deep or posterior cervical adenopathy.    Left cervical: No superficial, deep or posterior cervical adenopathy.     Upper Body:     Right upper body: No supraclavicular or axillary adenopathy.     Left upper body: No  supraclavicular or axillary adenopathy.     Lower Body: No right inguinal adenopathy. No left inguinal adenopathy.  Skin:    Coloration: Skin is not jaundiced.     Findings: No lesion or rash.  Neurological:     General: No focal deficit present.     Mental Status: She is alert and oriented to person, place, and time. Mental status is at baseline.  Psychiatric:        Mood and Affect: Mood normal.        Behavior: Behavior normal.        Thought Content: Thought content normal.        Judgment: Judgment normal.     LABS:      ASSESSMENT & PLAN:   Assessment/Plan:  A 34 y.o. female with elevated platelets.  When comparing her labs today to what they were previously, her platelets continue to rise.  Furthermore, her white count is higher.  Despite a negative JAK2 mutation test in the past, I am concerned an underlying myeloproliferative disorder is present.  I will send her peripheral blood for additional tests today, which will include the following:  Calreticulin mutation, MPL mutation, and FISH for CML (t9;22).  Despite having elevated counts, none of them is dangerously high to where cytoreductive therapy is  necessary.  I would only consider treatment if she tests positive for CML or if she develops a clotting episode.  Otherwise, as she is clinically doing well, I will see her back in 3 months for repeat clinical assessment.  The patient understands all the plans discussed today and is in agreement with them.    Lura Falor Macarthur Critchley, MD

## 2020-08-12 ENCOUNTER — Other Ambulatory Visit: Payer: Self-pay | Admitting: Oncology

## 2020-08-12 ENCOUNTER — Inpatient Hospital Stay: Payer: Commercial Managed Care - PPO | Attending: Oncology

## 2020-08-12 ENCOUNTER — Encounter: Payer: Self-pay | Admitting: Oncology

## 2020-08-12 ENCOUNTER — Telehealth: Payer: Self-pay | Admitting: Oncology

## 2020-08-12 ENCOUNTER — Inpatient Hospital Stay (INDEPENDENT_AMBULATORY_CARE_PROVIDER_SITE_OTHER): Payer: Commercial Managed Care - PPO | Admitting: Oncology

## 2020-08-12 ENCOUNTER — Other Ambulatory Visit: Payer: Self-pay

## 2020-08-12 VITALS — BP 135/93 | HR 100 | Temp 98.5°F | Resp 16 | Ht 60.0 in | Wt 210.8 lb

## 2020-08-12 DIAGNOSIS — D75839 Thrombocytosis, unspecified: Secondary | ICD-10-CM | POA: Diagnosis present

## 2020-08-12 LAB — IRON AND TIBC
Iron: 38 ug/dL (ref 28–170)
Saturation Ratios: 9 % — ABNORMAL LOW (ref 10.4–31.8)
TIBC: 403 ug/dL (ref 250–450)
UIBC: 365 ug/dL

## 2020-08-12 LAB — FERRITIN: Ferritin: 26 ng/mL (ref 11–307)

## 2020-08-12 NOTE — Telephone Encounter (Signed)
Per 11/3LOS.Next appt given to patient

## 2020-08-12 NOTE — Progress Notes (Signed)
Advised patient to keep a check on HTN readings.  If they continue to be elevated, she will need to contact. pcp.

## 2020-08-17 ENCOUNTER — Other Ambulatory Visit: Payer: Self-pay | Admitting: Oncology

## 2020-08-17 ENCOUNTER — Telehealth: Payer: Self-pay

## 2020-08-17 NOTE — Telephone Encounter (Addendum)
Pt's boyfriend, Ysidro Evert, called to ask for lab results. He spefically mentioned FISH results. [phone]  I notified Dr Bobby Rumpf of above. He states the tests are not back yet. He wants me to tell her that her iron levels are lower than before, & that alone can cause her platelets to be higher.  I called pt back, and notified her of above. She verbalized understanding.

## 2020-08-18 LAB — MPL MUTATION ANALYSIS

## 2020-08-21 LAB — CALRETICULIN (CALR) MUTATION ANALYSIS

## 2020-09-25 ENCOUNTER — Encounter: Payer: Self-pay | Admitting: Oncology

## 2020-11-11 IMAGING — DX PORTABLE CHEST - 1 VIEW
1 series · 1 of 1 positions shown · non-contrast
Comparison: 04/30/2019

CLINICAL DATA: Follow-up viral pneumonia.

EXAM:
PORTABLE CHEST 1 VIEW

[chest]
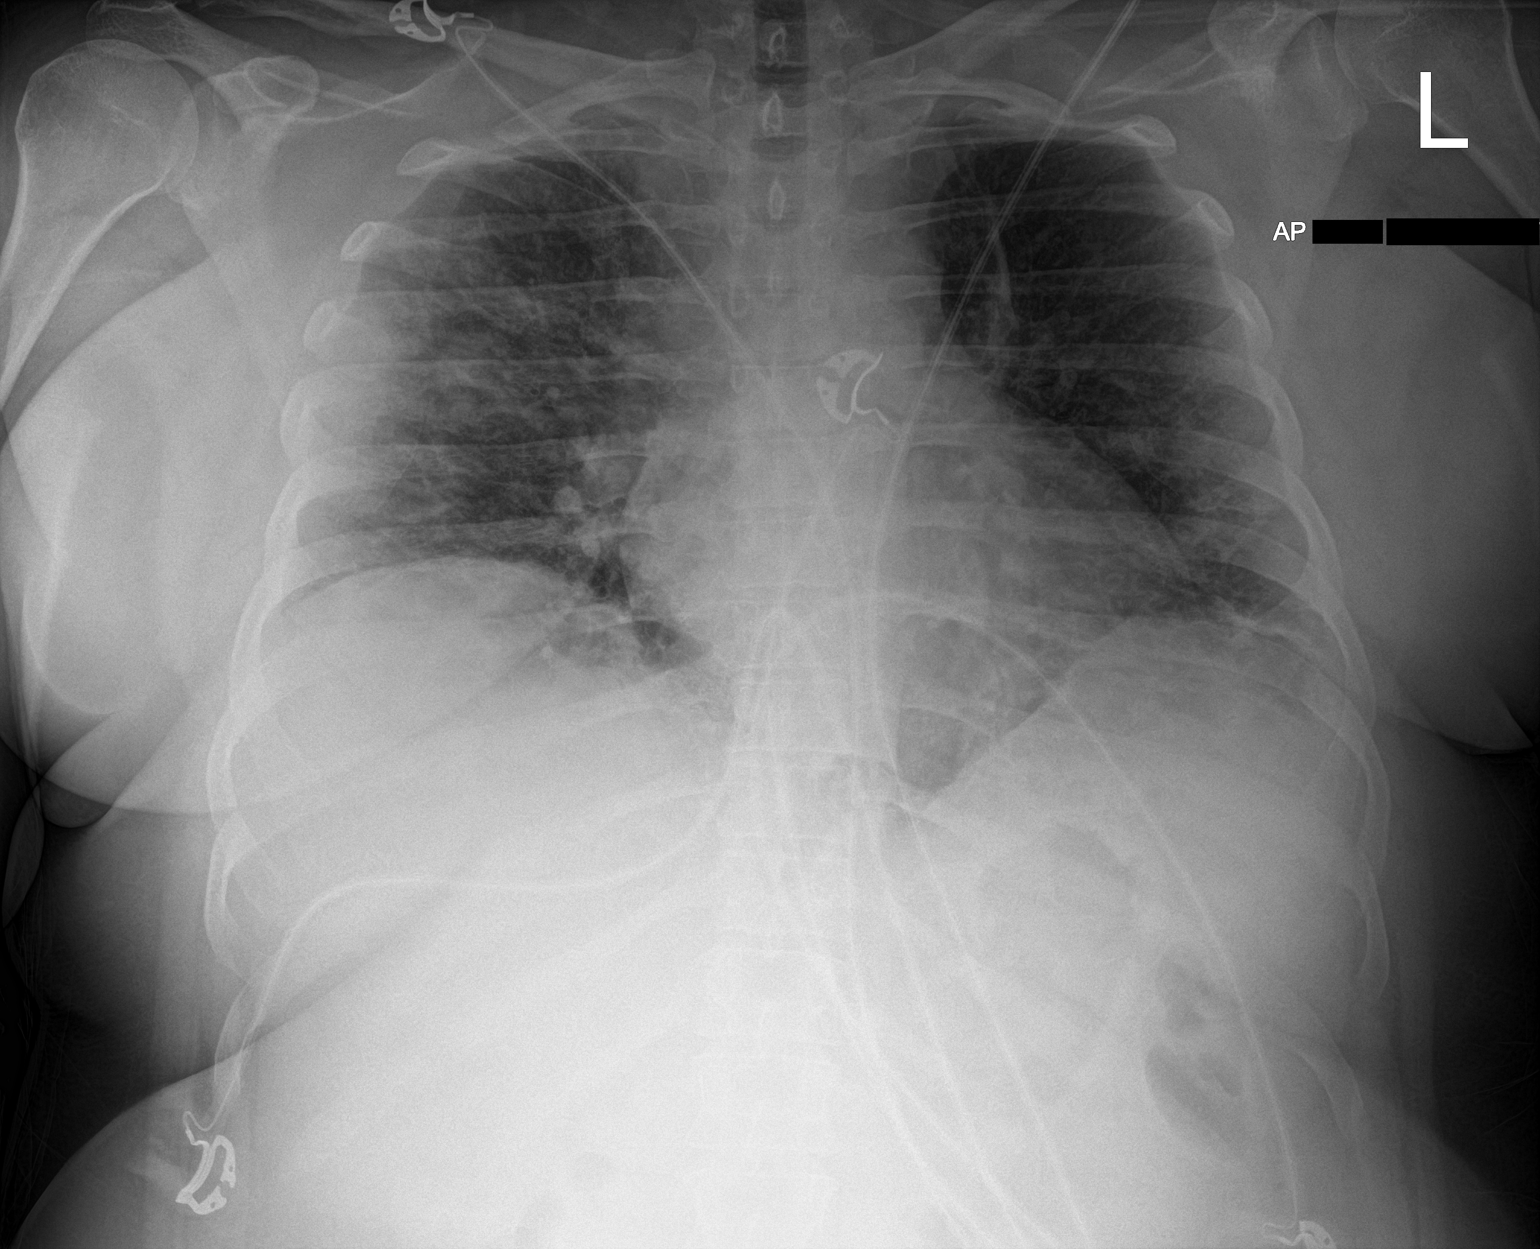

[1 of 1 positions shown; findings below may reference images not displayed]

FINDINGS: Widespread patchy bilateral pulmonary infiltrates persist, with
radiographic improvement. No dense consolidation, collapse or
effusion. No worsening or new finding.
IMPRESSION: Persistent patchy bilateral pulmonary infiltrates, improved since
yesterday.

## 2020-11-12 ENCOUNTER — Other Ambulatory Visit: Payer: Commercial Managed Care - PPO

## 2020-11-12 ENCOUNTER — Ambulatory Visit: Payer: Commercial Managed Care - PPO | Admitting: Oncology

## 2020-11-18 ENCOUNTER — Telehealth: Payer: Self-pay | Admitting: Oncology

## 2020-11-18 NOTE — Telephone Encounter (Signed)
Patient's Boyfriend called to Rescheduled 2/25 Labs, Follow Up.  Since patient works in [employer], she is requesting an order for her to have done on 2/22 in the Lab upstairs.  Also Rescheduled Follow Up to 2/23 at 11:15 a

## 2020-12-01 ENCOUNTER — Other Ambulatory Visit: Payer: Self-pay | Admitting: Oncology

## 2020-12-01 DIAGNOSIS — D75839 Thrombocytosis, unspecified: Secondary | ICD-10-CM

## 2020-12-01 NOTE — Progress Notes (Incomplete)
  [facility]  [address] [place],  [place]  [address] [phone]  Clinic Day:  12/01/2020  HISTORY OF PRESENT ILLNESS:  The patient is a 35 y.o. 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  In the past, JAK2 mutation testing was done, which came back negative for a potential underlying myeloproliferative disorder being present.  Furthermore, calreticulin and MPL mutation testing also came back negative for a myeloproliferative disorder being present.   She comes in today to reassess her platelets.  Since her last visit, the patient has been doing fairly well.  She denies having any underlying clotting complications as it pertains to her elevated platelet count.    PHYSICAL EXAM:  There were no vitals taken for this visit. Wt Readings from Last 3 Encounters:  08/12/20 210 lb 12.8 oz (95.6 kg)  05/01/19 214 lb 3.2 oz (97.2 kg)   There is no height or weight on file to calculate BMI. Performance status (ECOG): 0 Physical Exam Constitutional:      Appearance: Normal appearance.  HENT:     Mouth/Throat:     Pharynx: Oropharynx is clear. No oropharyngeal exudate.  Cardiovascular:     Rate and Rhythm: Normal rate and regular rhythm.     Heart sounds: No murmur heard. No friction rub. No gallop.   Pulmonary:     Breath sounds: Normal breath sounds.  Chest:  Breasts:     Right: No axillary adenopathy or supraclavicular adenopathy.     Left: No axillary adenopathy or supraclavicular adenopathy.    Abdominal:     General: Bowel sounds are normal. There is no distension.     Palpations: Abdomen is soft. There is no mass.     Tenderness: There is no abdominal tenderness.  Musculoskeletal:        General: No tenderness.     Cervical back: Normal range of motion and neck supple.     Right lower leg: No edema.     Left lower leg: No edema.  Lymphadenopathy:     Cervical: No cervical adenopathy.     Right cervical: No superficial, deep or posterior cervical  adenopathy.    Left cervical: No superficial, deep or posterior cervical adenopathy.     Upper Body:     Right upper body: No supraclavicular or axillary adenopathy.     Left upper body: No supraclavicular or axillary adenopathy.     Lower Body: No right inguinal adenopathy. No left inguinal adenopathy.  Skin:    Coloration: Skin is not jaundiced.     Findings: No lesion or rash.  Neurological:     General: No focal deficit present.     Mental Status: She is alert and oriented to person, place, and time. Mental status is at baseline.  Psychiatric:        Mood and Affect: Mood normal.        Behavior: Behavior normal.        Thought Content: Thought content normal.        Judgment: Judgment normal.     LABS:      ASSESSMENT & PLAN:   Assessment/Plan:  A 35 y.o. female with elevated platelets.  When comparing her labs today to what they were previously, her platelets .    The patient understands all the plans discussed today and is in agreement with them.    Dequincy Macarthur Critchley, MD

## 2020-12-01 NOTE — Progress Notes (Signed)
[facility]  [address] [place],  [place]  [address] [phone]  Clinic Day:  12/02/2020  HISTORY OF PRESENT ILLNESS:  The patient is a 35 y.o. 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  In the past, JAK2 mutation testing was done, which came back negative for a potential underlying myeloproliferative disorder being present.  She comes in today to reassess her platelets.  Since her last visit, the patient has been doing fairly well.  She denies having any underlying clotting complications as it pertains to her elevated platelet count.    PHYSICAL EXAM:  Blood pressure 123/85, pulse 92, temperature 98.6 F (37 C), resp. rate 16, height 5' (1.524 m), weight 211 lb 6.4 oz (95.9 kg), SpO2 96 %. Wt Readings from Last 3 Encounters:  12/02/20 211 lb 6.4 oz (95.9 kg)  08/12/20 210 lb 12.8 oz (95.6 kg)  05/01/19 214 lb 3.2 oz (97.2 kg)   Body mass index is 41.29 kg/m. Performance status (ECOG): 0 Physical Exam Constitutional:      Appearance: Normal appearance.  HENT:     Mouth/Throat:     Pharynx: Oropharynx is clear. No oropharyngeal exudate.  Cardiovascular:     Rate and Rhythm: Normal rate and regular rhythm.     Heart sounds: No murmur heard. No friction rub. No gallop.   Pulmonary:     Breath sounds: Normal breath sounds.  Chest:  Breasts:     Right: No axillary adenopathy or supraclavicular adenopathy.     Left: No axillary adenopathy or supraclavicular adenopathy.    Abdominal:     General: Bowel sounds are normal. There is no distension.     Palpations: Abdomen is soft. There is no mass.     Tenderness: There is no abdominal tenderness.  Musculoskeletal:        General: No tenderness.     Cervical back: Normal range of motion and neck supple.     Right lower leg: No edema.     Left lower leg: No edema.  Lymphadenopathy:     Cervical: No cervical adenopathy.     Right cervical: No superficial, deep or posterior cervical adenopathy.     Left cervical: No superficial, deep or posterior cervical adenopathy.     Upper Body:     Right upper body: No supraclavicular or axillary adenopathy.     Left upper body: No supraclavicular or axillary adenopathy.     Lower Body: No right inguinal adenopathy. No left inguinal adenopathy.  Skin:    Coloration: Skin is not jaundiced.     Findings: No lesion or rash.  Neurological:     General: No focal deficit present.     Mental Status: She is alert and oriented to person, place, and time. Mental status is at baseline.  Psychiatric:        Mood and Affect: Mood normal.        Behavior: Behavior normal.        Thought Content: Thought content normal.        Judgment: Judgment normal.     LABS:     ASSESSMENT & PLAN:  Assessment/Plan:  A 35 y.o. female with elevated platelets.  When comparing her labs today to what they were previously, her platelets remain relatively elevated.  However, JAK2 mutation, calreticulin mutation, and MPL mutation tests all came back negative for an underlying myeloproliferative disorder being present.  When evaluating her iron parameters, they are low normal.  They should be better, particularly  as she has been taking iron for over 1 year.  Iron deficiency can cause a reactive thrombocythemia.  Based upon this, I will give her IV Feraheme 1020 mg over these next few weeks to completely fortify her iron stores.  Hopefully, her platelets will also improve from her IV iron.  I will see her back in 3 months to reassess her hematologic parameters. The patient understands all the plans discussed today and is in agreement with them.    Adalberto Metzgar Macarthur Critchley, MD

## 2020-12-02 ENCOUNTER — Other Ambulatory Visit: Payer: Self-pay

## 2020-12-02 ENCOUNTER — Other Ambulatory Visit: Payer: Commercial Managed Care - PPO

## 2020-12-02 ENCOUNTER — Inpatient Hospital Stay: Payer: Commercial Managed Care - PPO | Attending: Oncology | Admitting: Oncology

## 2020-12-02 VITALS — BP 123/85 | HR 92 | Temp 98.6°F | Resp 16 | Ht 60.0 in | Wt 211.4 lb

## 2020-12-02 DIAGNOSIS — D509 Iron deficiency anemia, unspecified: Secondary | ICD-10-CM | POA: Insufficient documentation

## 2020-12-02 DIAGNOSIS — D508 Other iron deficiency anemias: Secondary | ICD-10-CM | POA: Diagnosis not present

## 2020-12-02 DIAGNOSIS — D75839 Thrombocytosis, unspecified: Secondary | ICD-10-CM | POA: Diagnosis not present

## 2020-12-04 ENCOUNTER — Other Ambulatory Visit: Payer: Commercial Managed Care - PPO

## 2020-12-04 ENCOUNTER — Ambulatory Visit: Payer: Commercial Managed Care - PPO | Admitting: Oncology

## 2020-12-09 ENCOUNTER — Encounter: Payer: Self-pay | Admitting: Oncology

## 2021-02-11 ENCOUNTER — Other Ambulatory Visit: Payer: Self-pay | Admitting: Hematology and Oncology

## 2021-02-15 NOTE — Addendum Note (Signed)
Addended by: Juanetta Beets on: 02/15/2021 10:12 AM   Modules accepted: Orders

## 2021-02-16 ENCOUNTER — Telehealth: Payer: Self-pay | Admitting: Oncology

## 2021-02-16 NOTE — Telephone Encounter (Signed)
Patient scheduled for IV Feraheme 5/23, 5/27 - Patient aware of Appt's

## 2021-02-19 NOTE — Addendum Note (Signed)
Addended by: Juanetta Beets on: 02/19/2021 03:24 PM   Modules accepted: Orders

## 2021-02-22 ENCOUNTER — Telehealth: Payer: Self-pay | Admitting: Oncology

## 2021-02-22 NOTE — Telephone Encounter (Signed)
Per Staff Message, patients's Ins Approved Venofer (IV Iron) for 5 Treatments - Called patient and scheduled 3 more treatments - Also rescheduled Follow Up w/Dr Bobby Rumpf to 7/28

## 2021-02-24 ENCOUNTER — Other Ambulatory Visit: Payer: Self-pay

## 2021-02-24 ENCOUNTER — Inpatient Hospital Stay: Payer: Commercial Managed Care - PPO | Attending: Oncology

## 2021-02-24 ENCOUNTER — Other Ambulatory Visit: Payer: Self-pay | Admitting: Pharmacist

## 2021-02-24 VITALS — BP 152/90 | HR 98 | Temp 98.4°F | Resp 18

## 2021-02-24 DIAGNOSIS — E611 Iron deficiency: Secondary | ICD-10-CM | POA: Diagnosis present

## 2021-02-24 DIAGNOSIS — D508 Other iron deficiency anemias: Secondary | ICD-10-CM

## 2021-02-24 MED ORDER — SODIUM CHLORIDE 0.9 % IV SOLN
200.0000 mg | Freq: Once | INTRAVENOUS | Status: AC
  Administered 2021-02-24: 200 mg via INTRAVENOUS
  Filled 2021-02-24: qty 200

## 2021-02-24 MED ORDER — SODIUM CHLORIDE 0.9 % IV SOLN
Freq: Once | INTRAVENOUS | Status: AC
  Filled 2021-02-24: qty 250

## 2021-02-24 NOTE — Patient Instructions (Signed)

## 2021-02-25 ENCOUNTER — Inpatient Hospital Stay: Payer: Commercial Managed Care - PPO

## 2021-02-25 VITALS — BP 118/76 | HR 106 | Temp 98.5°F | Resp 18

## 2021-02-25 DIAGNOSIS — E611 Iron deficiency: Secondary | ICD-10-CM | POA: Diagnosis not present

## 2021-02-25 DIAGNOSIS — D508 Other iron deficiency anemias: Secondary | ICD-10-CM

## 2021-02-25 MED ORDER — SODIUM CHLORIDE 0.9 % IV SOLN
Freq: Once | INTRAVENOUS | Status: AC
  Filled 2021-02-25: qty 250

## 2021-02-25 MED ORDER — SODIUM CHLORIDE 0.9 % IV SOLN
200.0000 mg | Freq: Once | INTRAVENOUS | Status: AC
  Administered 2021-02-25: 200 mg via INTRAVENOUS
  Filled 2021-02-25: qty 200

## 2021-02-25 NOTE — Patient Instructions (Signed)

## 2021-03-01 ENCOUNTER — Inpatient Hospital Stay: Payer: Commercial Managed Care - PPO

## 2021-03-01 ENCOUNTER — Other Ambulatory Visit: Payer: Self-pay

## 2021-03-01 ENCOUNTER — Ambulatory Visit: Payer: Commercial Managed Care - PPO | Admitting: Oncology

## 2021-03-01 VITALS — BP 132/81 | HR 102 | Temp 98.2°F | Resp 18

## 2021-03-01 DIAGNOSIS — D508 Other iron deficiency anemias: Secondary | ICD-10-CM

## 2021-03-01 DIAGNOSIS — E611 Iron deficiency: Secondary | ICD-10-CM | POA: Diagnosis not present

## 2021-03-01 MED ORDER — SODIUM CHLORIDE 0.9 % IV SOLN
Freq: Once | INTRAVENOUS | Status: AC
  Filled 2021-03-01: qty 250

## 2021-03-01 MED ORDER — SODIUM CHLORIDE 0.9 % IV SOLN
200.0000 mg | Freq: Once | INTRAVENOUS | Status: AC
  Administered 2021-03-01: 200 mg via INTRAVENOUS
  Filled 2021-03-01: qty 200

## 2021-03-01 NOTE — Patient Instructions (Signed)

## 2021-03-02 ENCOUNTER — Inpatient Hospital Stay: Payer: Commercial Managed Care - PPO

## 2021-03-02 VITALS — BP 127/65 | HR 95 | Temp 98.6°F | Resp 16 | Wt 218.1 lb

## 2021-03-02 DIAGNOSIS — E611 Iron deficiency: Secondary | ICD-10-CM | POA: Diagnosis not present

## 2021-03-02 DIAGNOSIS — D508 Other iron deficiency anemias: Secondary | ICD-10-CM

## 2021-03-02 MED ORDER — SODIUM CHLORIDE 0.9 % IV SOLN
Freq: Once | INTRAVENOUS | Status: AC
  Filled 2021-03-02: qty 250

## 2021-03-02 MED ORDER — SODIUM CHLORIDE 0.9 % IV SOLN
200.0000 mg | Freq: Once | INTRAVENOUS | Status: AC
  Administered 2021-03-02: 200 mg via INTRAVENOUS
  Filled 2021-03-02: qty 200

## 2021-03-02 NOTE — Patient Instructions (Signed)

## 2021-03-05 ENCOUNTER — Inpatient Hospital Stay: Payer: Commercial Managed Care - PPO

## 2021-03-05 ENCOUNTER — Other Ambulatory Visit: Payer: Self-pay

## 2021-03-05 VITALS — BP 123/95 | HR 96 | Temp 98.7°F | Resp 18 | Ht 59.0 in | Wt 217.8 lb

## 2021-03-05 DIAGNOSIS — D508 Other iron deficiency anemias: Secondary | ICD-10-CM

## 2021-03-05 DIAGNOSIS — E611 Iron deficiency: Secondary | ICD-10-CM | POA: Diagnosis not present

## 2021-03-05 MED ORDER — SODIUM CHLORIDE 0.9 % IV SOLN
Freq: Once | INTRAVENOUS | Status: AC
  Filled 2021-03-05: qty 250

## 2021-03-05 MED ORDER — IRON SUCROSE 20 MG/ML IV SOLN
200.0000 mg | Freq: Once | INTRAVENOUS | Status: AC
  Administered 2021-03-05: 200 mg via INTRAVENOUS
  Filled 2021-03-05: qty 200

## 2021-03-05 NOTE — Progress Notes (Signed)
1508: PT STABLE AT TIME OF DISCHARGE

## 2021-03-05 NOTE — Patient Instructions (Signed)

## 2021-04-08 ENCOUNTER — Telehealth: Payer: Self-pay | Admitting: Oncology

## 2021-04-08 NOTE — Telephone Encounter (Signed)
Patient rescheduled 7/28 Follow Up to 8/8 at 9:00 am (due to New Job)

## 2021-04-29 ENCOUNTER — Other Ambulatory Visit: Payer: Self-pay

## 2021-05-03 NOTE — Progress Notes (Signed)
[facility]  [address] [place],  [place]  [address] [phone]  Clinic Day:  05/17/2021  This document serves as a record of services personally performed by Marice Potter, MD. It was created on their behalf by Curry,Lauren E, a trained medical scribe. The creation of this record is based on the scribe's personal observations and the provider's statements to them.  HISTORY OF PRESENT ILLNESS:  The patient is a 35 y.o. 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  She comes in today to reassess her hematologic parameters after receiving her IV iron.  In the past, JAK2, CALR, MPL mutation testing all came back negative for a potential underlying myeloproliferative disorder being present.  Since her last visit, the patient has been doing well.  She has felt somewhat better after receiving her IV iron.  She claims her menstrual cycles remain light and denies having other overt forms of blood loss.   PHYSICAL EXAM:  Blood pressure 128/88, pulse 97, temperature 98.8 F (37.1 C), resp. rate 14, height 5' (1.524 m), weight 216 lb (98 kg), SpO2 98 %. Wt Readings from Last 3 Encounters:  05/17/21 216 lb (98 kg)  03/05/21 217 lb 12 oz (98.8 kg)  03/02/21 218 lb 1.9 oz (98.9 kg)   Body mass index is 42.18 kg/m. Performance status (ECOG): 0 Physical Exam Constitutional:      Appearance: Normal appearance.  HENT:     Mouth/Throat:     Pharynx: Oropharynx is clear. No oropharyngeal exudate.  Cardiovascular:     Rate and Rhythm: Normal rate and regular rhythm.     Heart sounds: No murmur heard.   No friction rub. No gallop.  Pulmonary:     Breath sounds: Normal breath sounds.  Chest:  Breasts:    Right: No axillary adenopathy or supraclavicular adenopathy.     Left: No axillary adenopathy or supraclavicular adenopathy.  Abdominal:     General: Bowel sounds are normal. There is no distension.     Palpations: Abdomen is soft.  There is no mass.     Tenderness: There is no abdominal tenderness.  Musculoskeletal:        General: No tenderness.     Cervical back: Normal range of motion and neck supple.     Right lower leg: No edema.     Left lower leg: No edema.  Lymphadenopathy:     Cervical: No cervical adenopathy.     Right cervical: No superficial, deep or posterior cervical adenopathy.    Left cervical: No superficial, deep or posterior cervical adenopathy.     Upper Body:     Right upper body: No supraclavicular or axillary adenopathy.     Left upper body: No supraclavicular or axillary adenopathy.     Lower Body: No right inguinal adenopathy. No left inguinal adenopathy.  Skin:    Coloration: Skin is not jaundiced.     Findings: No lesion or rash.  Neurological:     General: No focal deficit present.     Mental Status: She is alert and oriented to person, place, and time. Mental status is at baseline.  Psychiatric:        Mood and Affect: Mood normal.        Behavior: Behavior normal.        Thought Content: Thought content normal.        Judgment: Judgment normal.    LABS:      ASSESSMENT & PLAN:  Assessment/Plan:  A 35 y.o. female with elevated platelets secondary to iron deficiency anemia.  Although her platelets remain high, they are not as high as they were previously.  Furthermore, her hemoglobin and iron parameters are improved after receiving IV iron.  As mentioned previously, labs done in the past showed no evidence of a myeloproliferative disorder being present.  As her platelets are less than 500 and her hemoglobin is normal, her peripheral counts will continue to be followed conservatively.  I will see her back in 6 months for repeat clinical assessment.  The patient understands all the plans discussed today and is in agreement with them.    I, Rita Ohara, am acting as scribe for Marice Potter, MD    I have reviewed this report as typed by the medical scribe, and it is complete  and accurate.  Aristotle Lieb Macarthur Critchley, MD

## 2021-05-06 ENCOUNTER — Ambulatory Visit: Payer: Commercial Managed Care - PPO | Admitting: Oncology

## 2021-05-16 ENCOUNTER — Other Ambulatory Visit: Payer: Self-pay | Admitting: Oncology

## 2021-05-16 DIAGNOSIS — D508 Other iron deficiency anemias: Secondary | ICD-10-CM

## 2021-05-17 ENCOUNTER — Other Ambulatory Visit: Payer: Self-pay

## 2021-05-17 ENCOUNTER — Telehealth: Payer: Self-pay | Admitting: Oncology

## 2021-05-17 ENCOUNTER — Other Ambulatory Visit: Payer: Self-pay | Admitting: Oncology

## 2021-05-17 ENCOUNTER — Inpatient Hospital Stay: Payer: Commercial Managed Care - PPO | Attending: Oncology | Admitting: Oncology

## 2021-05-17 VITALS — BP 128/88 | HR 97 | Temp 98.8°F | Resp 14 | Ht 60.0 in | Wt 216.0 lb

## 2021-05-17 DIAGNOSIS — D75839 Thrombocytosis, unspecified: Secondary | ICD-10-CM | POA: Diagnosis not present

## 2021-05-17 DIAGNOSIS — D508 Other iron deficiency anemias: Secondary | ICD-10-CM

## 2021-05-17 NOTE — Telephone Encounter (Signed)
Per 8/8 LOS, patient scheduled for Feb 2023 Follow Up Appt - She is [employer] Employee and she will have Labs Drawn before the Appt

## 2021-05-18 ENCOUNTER — Encounter: Payer: Self-pay | Admitting: Oncology

## 2021-11-09 NOTE — Progress Notes (Signed)
[facility]  [address] [place],  [place]  [address] [phone]  Clinic Day:  11/15/2021  This document serves as a record of services personally performed by Marice Potter, MD. It was created on their behalf by Curry,Lauren E, a trained medical scribe. The creation of this record is based on the scribe's personal observations and the provider's statements to them.  HISTORY OF PRESENT ILLNESS:  The patient is a 36 y.o. 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  She comes in today to reassess her hematologic parameters.  In the past, JAK2, CALR, MPL mutation testing all came back negative for a potential underlying myeloproliferative disorder being present.  Since her last visit, the patient has been doing well.  She claims her menstrual cycles remain light and denies having other overt forms of blood loss.   PHYSICAL EXAM:  Blood pressure (!) 130/91, pulse 96, temperature 99 F (37.2 C), resp. rate 16, height 5' (1.524 m), weight 214 lb 12.8 oz (97.4 kg), SpO2 98 %. Wt Readings from Last 3 Encounters:  11/15/21 214 lb 12.8 oz (97.4 kg)  05/17/21 216 lb (98 kg)  03/05/21 217 lb 12 oz (98.8 kg)   Body mass index is 41.95 kg/m. Performance status (ECOG): 0 Physical Exam Constitutional:      Appearance: Normal appearance.  HENT:     Mouth/Throat:     Pharynx: Oropharynx is clear. No oropharyngeal exudate.  Cardiovascular:     Rate and Rhythm: Normal rate and regular rhythm.     Heart sounds: No murmur heard.   No friction rub. No gallop.  Pulmonary:     Breath sounds: Normal breath sounds.  Abdominal:     General: Bowel sounds are normal. There is no distension.     Palpations: Abdomen is soft. There is no mass.     Tenderness: There is no abdominal tenderness.  Musculoskeletal:        General: No tenderness.     Cervical back: Normal range of motion and neck supple.     Right lower leg: No edema.     Left  lower leg: No edema.  Lymphadenopathy:     Cervical: No cervical adenopathy.     Right cervical: No superficial, deep or posterior cervical adenopathy.    Left cervical: No superficial, deep or posterior cervical adenopathy.     Upper Body:     Right upper body: No supraclavicular or axillary adenopathy.     Left upper body: No supraclavicular or axillary adenopathy.     Lower Body: No right inguinal adenopathy. No left inguinal adenopathy.  Skin:    Coloration: Skin is not jaundiced.     Findings: No lesion or rash.  Neurological:     General: No focal deficit present.     Mental Status: She is alert and oriented to person, place, and time. Mental status is at baseline.  Psychiatric:        Mood and Affect: Mood normal.        Behavior: Behavior normal.        Thought Content: Thought content normal.        Judgment: Judgment normal.    LABS:      11/13/21 00:00  Iron 41  TIBC 320  %SAT 12.8  Ferritin 154   ASSESSMENT & PLAN:  Assessment/Plan:  A 36 y.o. female with elevated platelets secondary to iron deficiency anemia.  When evaluating her labs today, her hemoglobin  is actually better than what it was 6 months ago.  When evaluating her recent iron parameters, her elevated ferritin, in the setting of a low-normal iron and iron saturation level, is more consistent with "anemia" of chronic disease more so than iron deficiency.  Nonetheless, as the patient has had increased ice cravings, a soluble transferrin receptor will be checked today.  If elevated, it would raise the suspicion for iron deficiency high enough to where I would consider giving her a repeat course of IV iron.  As mentioned previously, despite her having chronic leukocytosis and thrombocythemia, labs in the past showed no evidence of a myeloproliferative disorder being present.  Overall, the patient appears to be doing well.  As that is the case, I will see her back in 6 months for repeat clinical assessment.  The  patient understands all the plans discussed today and is in agreement with them.    I, Rita Ohara, am acting as scribe for Marice Potter, MD    I have reviewed this report as typed by the medical scribe, and it is complete and accurate.  Victorino Fatzinger Macarthur Critchley, MD

## 2021-11-13 ENCOUNTER — Encounter: Payer: Self-pay | Admitting: Oncology

## 2021-11-13 LAB — IRON,TIBC AND FERRITIN PANEL
%SAT: 12.8
Ferritin: 154
Iron: 41
TIBC: 320

## 2021-11-13 LAB — CBC: RBC: 4.71 (ref 3.87–5.11)

## 2021-11-13 LAB — CBC AND DIFFERENTIAL
HCT: 40 (ref 36–46)
Hemoglobin: 13.6 (ref 12.0–16.0)
Platelets: 501 — AB (ref 150–399)
WBC: 11.7

## 2021-11-15 ENCOUNTER — Inpatient Hospital Stay: Payer: Commercial Managed Care - PPO | Attending: Oncology | Admitting: Oncology

## 2021-11-15 ENCOUNTER — Other Ambulatory Visit: Payer: Self-pay

## 2021-11-15 ENCOUNTER — Encounter: Payer: Self-pay | Admitting: Oncology

## 2021-11-15 VITALS — BP 130/91 | HR 96 | Temp 99.0°F | Resp 16 | Ht 60.0 in | Wt 214.8 lb

## 2021-11-15 DIAGNOSIS — D75839 Thrombocytosis, unspecified: Secondary | ICD-10-CM | POA: Diagnosis not present

## 2021-11-15 DIAGNOSIS — D508 Other iron deficiency anemias: Secondary | ICD-10-CM | POA: Diagnosis not present

## 2021-11-17 ENCOUNTER — Telehealth: Payer: Self-pay

## 2021-11-17 ENCOUNTER — Ambulatory Visit: Payer: Commercial Managed Care - PPO | Admitting: Oncology

## 2021-11-18 ENCOUNTER — Encounter: Payer: Self-pay | Admitting: Oncology

## 2021-11-18 NOTE — Telephone Encounter (Signed)
Soluble transferrin result is 17.2, which is normal. Dr Bobby Rumpf asked met to tell pt that her numbers are not indicative of Iron deficiency. She does not need IV iron @ this time. She will f/u in 6 months as below.  Pt notified and verbalized understanding.     11/13/21 00:00  Iron 41  TIBC 320  %SAT 12.8  Ferritin 154    ASSESSMENT & PLAN:  Assessment/Plan:  A 36 y.o. female with elevated platelets secondary to iron deficiency anemia.  When evaluating her labs today, her hemoglobin is actually better than what it was 6 months ago.  When evaluating her recent iron parameters, her elevated ferritin, in the setting of a low-normal iron and iron saturation level, is more consistent with "anemia" of chronic disease more so than iron deficiency.  Nonetheless, as the patient has had increased ice cravings, a soluble transferrin receptor will be checked today.  If elevated, it would raise the suspicion for iron deficiency high enough to where I would consider giving her a repeat course of IV iron.  As mentioned previously, despite her having chronic leukocytosis and thrombocythemia, labs in the past showed no evidence of a myeloproliferative disorder being present.  Overall, the patient appears to be doing well.  As that is the case, I will see her back in 6 months for repeat clinical assessment.  The patient understands all the plans discussed today and is in agreement with them.     I, Rita Ohara, am acting as scribe for Marice Potter, MD     I have reviewed this report as typed by the medical scribe, and it is complete and accurate.   Dequincy Macarthur Critchley, MD

## 2022-05-16 ENCOUNTER — Ambulatory Visit: Payer: Commercial Managed Care - PPO | Admitting: Oncology

## 2022-05-24 NOTE — Progress Notes (Signed)
[place]  [address] [place],  [place]  [address] [phone]  Clinic Day:  05/25/2022  HISTORY OF PRESENT ILLNESS:  The patient is a 36 y.o. 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  She comes in today to reassess her hematologic parameters.  In the past, JAK2, CALR, MPL mutation testing all came back negative for a potential underlying myeloproliferative disorder being present.  Since her last visit, the patient has been doing well.  She claims her menstrual cycles remain light and denies having other overt forms of blood loss.   PHYSICAL EXAM:  Blood pressure 130/84, pulse (!) 104, temperature 99 F (37.2 C), resp. rate 16, height 5' (1.524 m), weight 212 lb 4.8 oz (96.3 kg), SpO2 95 %. Wt Readings from Last 3 Encounters:  05/25/22 212 lb 4.8 oz (96.3 kg)  11/15/21 214 lb 12.8 oz (97.4 kg)  05/17/21 216 lb (98 kg)   Body mass index is 41.46 kg/m. Performance status (ECOG): 0 Physical Exam Constitutional:      Appearance: Normal appearance.  HENT:     Mouth/Throat:     Pharynx: Oropharynx is clear. No oropharyngeal exudate.  Cardiovascular:     Rate and Rhythm: Normal rate and regular rhythm.     Heart sounds: No murmur heard.    No friction rub. No gallop.  Pulmonary:     Breath sounds: Normal breath sounds.  Abdominal:     General: Bowel sounds are normal. There is no distension.     Palpations: Abdomen is soft. There is no mass.     Tenderness: There is no abdominal tenderness.  Musculoskeletal:        General: No tenderness.     Cervical back: Normal range of motion and neck supple.     Right lower leg: No edema.     Left lower leg: No edema.  Lymphadenopathy:     Cervical: No cervical adenopathy.     Right cervical: No superficial, deep or posterior cervical adenopathy.    Left cervical: No superficial, deep or posterior cervical adenopathy.     Upper Body:     Right upper body: No  supraclavicular or axillary adenopathy.     Left upper body: No supraclavicular or axillary adenopathy.     Lower Body: No right inguinal adenopathy. No left inguinal adenopathy.  Skin:    Coloration: Skin is not jaundiced.     Findings: No lesion or rash.  Neurological:     General: No focal deficit present.     Mental Status: She is alert and oriented to person, place, and time. Mental status is at baseline.  Psychiatric:        Mood and Affect: Mood normal.        Behavior: Behavior normal.        Thought Content: Thought content normal.        Judgment: Judgment normal.     LABS:         ASSESSMENT & PLAN:  Assessment/Plan:  A 36 y.o. female with elevated platelets secondary to iron deficiency anemia.  When evaluating her labs today, both her platelets and white count are only minimally elevated.  Her hemoglobin remains normal.  When evaluating her recent iron parameters, her normal ferritin, in the setting of both low iron and iron saturation levels, is more consistent with "anemia" of chronic disease more so than iron deficiency.  The patient does have both diabetes and polycystic ovarian syndrome.  Overall, her labs are not much different than what they have been over the past year.  Clinically, she appears to be doing well.  As that is the case, I will see her back in 1 year for repeat clinical assessment.  The patient understands all the plans discussed today and is in agreement with them.    Obi Scrima Macarthur Critchley, MD

## 2022-05-25 ENCOUNTER — Inpatient Hospital Stay: Payer: Commercial Managed Care - PPO | Attending: Oncology | Admitting: Oncology

## 2022-05-25 ENCOUNTER — Other Ambulatory Visit: Payer: Self-pay | Admitting: Oncology

## 2022-05-25 VITALS — BP 130/84 | HR 104 | Temp 99.0°F | Resp 16 | Ht 60.0 in | Wt 212.3 lb

## 2022-05-25 DIAGNOSIS — D508 Other iron deficiency anemias: Secondary | ICD-10-CM

## 2023-05-26 ENCOUNTER — Ambulatory Visit: Payer: Commercial Managed Care - PPO | Admitting: Oncology

## 2023-05-27 LAB — CBC: RBC: 4.99 (ref 3.87–5.11)

## 2023-05-27 LAB — CBC AND DIFFERENTIAL
HCT: 43 (ref 36–46)
Hemoglobin: 14.1 (ref 12.0–16.0)
Neutrophils Absolute: 9.1
Platelets: 526 10*3/uL — AB (ref 150–400)
WBC: 13

## 2023-05-27 LAB — IRON,TIBC AND FERRITIN PANEL
%SAT: 12.4
Ferritin: 107
Iron: 40
TIBC: 322

## 2023-05-28 NOTE — Progress Notes (Unsigned)
[facility]  [address] [place],  [place]  [address] [phone]  Clinic Day:  05/29/2023  HISTORY OF PRESENT ILLNESS:  The patient is a 37 y.o. 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  She comes in today to reassess her hematologic parameters.  In the past, JAK2, CALR, MPL mutation testing all came back negative for a potential underlying myeloproliferative disorder being present.  Since her last visit, the patient has been doing well.  She claims her menstrual cycles remain light and denies having other overt forms of blood loss.   PHYSICAL EXAM:  Blood pressure 123/79, pulse 97, temperature 99.3 F (37.4 C), resp. rate 16, height 5' (1.524 m), weight 197 lb 3.2 oz (89.4 kg), SpO2 98%. Wt Readings from Last 3 Encounters:  05/29/23 197 lb 3.2 oz (89.4 kg)  05/25/22 212 lb 4.8 oz (96.3 kg)  11/15/21 214 lb 12.8 oz (97.4 kg)   Body mass index is 38.51 kg/m. Performance status (ECOG): 0 Physical Exam Constitutional:      Appearance: Normal appearance.  HENT:     Mouth/Throat:     Pharynx: Oropharynx is clear. No oropharyngeal exudate.  Cardiovascular:     Rate and Rhythm: Normal rate and regular rhythm.     Heart sounds: No murmur heard.    No friction rub. No gallop.  Pulmonary:     Breath sounds: Normal breath sounds.  Abdominal:     General: Bowel sounds are normal. There is no distension.     Palpations: Abdomen is soft. There is no mass.     Tenderness: There is no abdominal tenderness.  Musculoskeletal:        General: No tenderness.     Cervical back: Normal range of motion and neck supple.     Right lower leg: No edema.     Left lower leg: No edema.  Lymphadenopathy:     Cervical: No cervical adenopathy.     Right cervical: No superficial, deep or posterior cervical adenopathy.    Left cervical: No superficial, deep or posterior cervical adenopathy.     Upper Body:     Right upper body: No  supraclavicular or axillary adenopathy.     Left upper body: No supraclavicular or axillary adenopathy.     Lower Body: No right inguinal adenopathy. No left inguinal adenopathy.  Skin:    Coloration: Skin is not jaundiced.     Findings: No lesion or rash.  Neurological:     General: No focal deficit present.     Mental Status: She is alert and oriented to person, place, and time. Mental status is at baseline.  Psychiatric:        Mood and Affect: Mood normal.        Behavior: Behavior normal.        Thought Content: Thought content normal.        Judgment: Judgment normal.   LABS:         ASSESSMENT & PLAN:  Assessment/Plan:  A 37 y.o. female with elevated platelets secondary to iron deficiency anemia.  When evaluating her labs today, both her platelets and white count are higher than previously.  However, her hemoglobin is better as well.  Collectively, her iron parameters are not consistent with iron deficiency.  Overall, her labs are not much different than what they have been.  However, I do want to see her back in 6 months for repeat clinical assessment.  The patient understands  all the plans discussed today and is in agreement with them.    Taft Worthing Kirby Funk, MD

## 2023-05-29 ENCOUNTER — Inpatient Hospital Stay: Payer: Commercial Managed Care - PPO | Attending: Oncology | Admitting: Oncology

## 2023-05-29 ENCOUNTER — Other Ambulatory Visit: Payer: Self-pay | Admitting: Oncology

## 2023-05-29 VITALS — BP 123/79 | HR 97 | Temp 99.3°F | Resp 16 | Ht 60.0 in | Wt 197.2 lb

## 2023-05-29 DIAGNOSIS — D508 Other iron deficiency anemias: Secondary | ICD-10-CM

## 2023-05-31 ENCOUNTER — Telehealth: Payer: Self-pay | Admitting: Oncology

## 2023-05-31 NOTE — Telephone Encounter (Signed)
Contacted pt to schedule an appt. Unable to reach via phone numbers listed in her Demographics.  Message Header  Department: [facility] [[id number]]   Scheduling Message Entered by Rennis Harding A on 05/29/2023 at 12:07 PM Priority: Routine <No visit type provided>  Department: [facility]  Provider:  Scheduling Notes:  Labs/appt 11-29-23

## 2023-06-08 NOTE — Telephone Encounter (Signed)
Patient has been scheduled. Aware of appt date and time  

## 2023-11-20 ENCOUNTER — Encounter: Payer: Self-pay | Admitting: Oncology

## 2023-11-26 NOTE — Progress Notes (Unsigned)
 [facility]  [address] [place],  [place]  [address] [phone]  Clinic Day:  11/26/2023  HISTORY OF PRESENT ILLNESS:  The patient is a 38 y.o. 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  She comes in today to reassess her hematologic parameters.  In the past, JAK2, CALR, MPL mutation testing all came back negative for a potential underlying myeloproliferative disorder being present.  Since her last visit, the patient has been doing well.  She claims her menstrual cycles remain light and denies having other overt forms of blood loss.   PHYSICAL EXAM:  There were no vitals taken for this visit. Wt Readings from Last 3 Encounters:  05/29/23 197 lb 3.2 oz (89.4 kg)  05/25/22 212 lb 4.8 oz (96.3 kg)  11/15/21 214 lb 12.8 oz (97.4 kg)   There is no height or weight on file to calculate BMI. Performance status (ECOG): 0 Physical Exam Constitutional:      Appearance: Normal appearance.  HENT:     Mouth/Throat:     Pharynx: Oropharynx is clear. No oropharyngeal exudate.  Cardiovascular:     Rate and Rhythm: Normal rate and regular rhythm.     Heart sounds: No murmur heard.    No friction rub. No gallop.  Pulmonary:     Breath sounds: Normal breath sounds.  Abdominal:     General: Bowel sounds are normal. There is no distension.     Palpations: Abdomen is soft. There is no mass.     Tenderness: There is no abdominal tenderness.  Musculoskeletal:        General: No tenderness.     Cervical back: Normal range of motion and neck supple.     Right lower leg: No edema.     Left lower leg: No edema.  Lymphadenopathy:     Cervical: No cervical adenopathy.     Right cervical: No superficial, deep or posterior cervical adenopathy.    Left cervical: No superficial, deep or posterior cervical adenopathy.     Upper Body:     Right upper body: No supraclavicular or axillary adenopathy.     Left upper body: No supraclavicular or  axillary adenopathy.     Lower Body: No right inguinal adenopathy. No left inguinal adenopathy.  Skin:    Coloration: Skin is not jaundiced.     Findings: No lesion or rash.  Neurological:     General: No focal deficit present.     Mental Status: She is alert and oriented to person, place, and time. Mental status is at baseline.  Psychiatric:        Mood and Affect: Mood normal.        Behavior: Behavior normal.        Thought Content: Thought content normal.        Judgment: Judgment normal.    LABS:         ASSESSMENT & PLAN:  Assessment/Plan:  A 38 y.o. female with elevated platelets secondary to iron deficiency anemia.  When evaluating her labs today, both her platelets and white count are higher than previously.  However, her hemoglobin is better as well.  Collectively, her iron parameters are not consistent with iron deficiency.  Overall, her labs are not much different than what they have been.  However, I do want to see her back in 6 months for repeat clinical assessment.  The patient understands all the plans discussed today and is in agreement with them.  Courtney Barrera Kirby Funk, MD

## 2023-11-27 ENCOUNTER — Encounter: Payer: Self-pay | Admitting: Oncology

## 2023-11-27 ENCOUNTER — Inpatient Hospital Stay: Payer: Commercial Managed Care - PPO

## 2023-11-27 ENCOUNTER — Other Ambulatory Visit: Payer: Self-pay | Admitting: Oncology

## 2023-11-27 ENCOUNTER — Inpatient Hospital Stay: Payer: Commercial Managed Care - PPO | Attending: Oncology | Admitting: Oncology

## 2023-11-27 VITALS — BP 108/85 | HR 95 | Temp 98.0°F | Resp 16 | Ht 60.0 in | Wt 200.6 lb

## 2023-11-27 DIAGNOSIS — D75839 Thrombocytosis, unspecified: Secondary | ICD-10-CM | POA: Diagnosis not present

## 2023-11-27 DIAGNOSIS — D509 Iron deficiency anemia, unspecified: Secondary | ICD-10-CM | POA: Diagnosis present

## 2023-11-27 DIAGNOSIS — D508 Other iron deficiency anemias: Secondary | ICD-10-CM

## 2023-11-27 LAB — CBC WITH DIFFERENTIAL (CANCER CENTER ONLY)
Abs Immature Granulocytes: 0.04 10*3/uL (ref 0.00–0.07)
Basophils Absolute: 0.1 10*3/uL (ref 0.0–0.1)
Basophils Relative: 1 %
Eosinophils Absolute: 0.1 10*3/uL (ref 0.0–0.5)
Eosinophils Relative: 1 %
HCT: 43.7 % (ref 36.0–46.0)
Hemoglobin: 14.3 g/dL (ref 12.0–15.0)
Immature Granulocytes: 0 %
Lymphocytes Relative: 22 %
Lymphs Abs: 2.4 10*3/uL (ref 0.7–4.0)
MCH: 27.9 pg (ref 26.0–34.0)
MCHC: 32.7 g/dL (ref 30.0–36.0)
MCV: 85.4 fL (ref 80.0–100.0)
Monocytes Absolute: 0.8 10*3/uL (ref 0.1–1.0)
Monocytes Relative: 7 %
Neutro Abs: 7.5 10*3/uL (ref 1.7–7.7)
Neutrophils Relative %: 69 %
Platelet Count: 568 10*3/uL — ABNORMAL HIGH (ref 150–400)
RBC: 5.12 MIL/uL — ABNORMAL HIGH (ref 3.87–5.11)
RDW: 13.2 % (ref 11.5–15.5)
WBC Count: 10.9 10*3/uL — ABNORMAL HIGH (ref 4.0–10.5)
nRBC: 0 % (ref 0.0–0.2)
nRBC: 0 /100{WBCs}

## 2023-11-27 LAB — IRON AND TIBC
Iron: 64 ug/dL (ref 28–170)
Saturation Ratios: 16 % (ref 10.4–31.8)
TIBC: 396 ug/dL (ref 250–450)
UIBC: 332 ug/dL

## 2023-11-27 LAB — FERRITIN: Ferritin: 72 ng/mL (ref 11–307)

## 2023-11-27 NOTE — Progress Notes (Signed)
 [facility]  [address] [place],  [place]  [address] [phone]  Clinic Day:  11/27/2023  Referring physician: Delmer Islam, NP-C   HISTORY OF PRESENT ILLNESS:  The patient is a 38 y.o. female with mild thrombocythemia, with a component secondary to previous iron deficiency.  She comes in today to reassess her hematologic parameters.  In the past, JAK2, CALR, MPL mutation testing all came back negative for an underlying myeloproliferative disorder being behind her elevated platelets.  She comes in today for routine follow-up.  Since her last visit, the patient has been doing well.  She claims her menstrual cycles remain light and denies having other overt forms of blood loss.   She also denies having any clotting complications from her elevated platelets.    PHYSICAL EXAM:  Blood pressure 108/85, pulse 95, temperature 98 F (36.7 C), temperature source Oral, resp. rate 16, height 5' (1.524 m), weight 200 lb 9.6 oz (91 kg), SpO2 97%. Wt Readings from Last 3 Encounters:  11/27/23 200 lb 9.6 oz (91 kg)  05/29/23 197 lb 3.2 oz (89.4 kg)  05/25/22 212 lb 4.8 oz (96.3 kg)   Body mass index is 39.18 kg/m. Performance status (ECOG): 0 - Asymptomatic Physical Exam Constitutional:      Appearance: Normal appearance. She is not ill-appearing.  HENT:     Mouth/Throat:     Mouth: Mucous membranes are moist.     Pharynx: Oropharynx is clear. No oropharyngeal exudate or posterior oropharyngeal erythema.  Cardiovascular:     Rate and Rhythm: Normal rate and regular rhythm.     Heart sounds: No murmur heard.    No friction rub. No gallop.  Pulmonary:     Effort: Pulmonary effort is normal. No respiratory distress.     Breath sounds: Normal breath sounds. No wheezing, rhonchi or rales.  Abdominal:     General: Bowel sounds are normal. There is no distension.     Palpations: Abdomen is soft. There is no mass.     Tenderness: There is no abdominal  tenderness.  Musculoskeletal:        General: No swelling.     Right lower leg: No edema.     Left lower leg: No edema.  Lymphadenopathy:     Cervical: No cervical adenopathy.     Upper Body:     Right upper body: No supraclavicular or axillary adenopathy.     Left upper body: No supraclavicular or axillary adenopathy.     Lower Body: No right inguinal adenopathy. No left inguinal adenopathy.  Skin:    General: Skin is warm.     Coloration: Skin is not jaundiced.     Findings: No lesion or rash.  Neurological:     General: No focal deficit present.     Mental Status: She is alert and oriented to person, place, and time. Mental status is at baseline.  Psychiatric:        Mood and Affect: Mood normal.        Behavior: Behavior normal.        Thought Content: Thought content normal.   LABS:      Latest Ref Rng & Units 11/27/2023    9:13 AM 05/27/2023   12:00 AM 11/13/2021   12:00 AM  CBC  WBC 4.0 - 10.5 K/uL 10.9  13.0     11.7   Hemoglobin 12.0 - 15.0 g/dL 86.5  78.4     69.6   Hematocrit  36.0 - 46.0 % 43.7  43     40   Platelets 150 - 400 K/uL 568  526     501      This result is from an external source.      Latest Ref Rng & Units 05/05/2019    3:23 AM 05/04/2019    2:15 AM 05/03/2019    2:00 AM  CMP  Glucose 70 - 99 mg/dL 161  096  045   BUN 6 - 20 mg/dL 25  21  26    Creatinine 0.44 - 1.00 mg/dL 4.09  8.11  9.14   Sodium 135 - 145 mmol/L 135  136  135   Potassium 3.5 - 5.1 mmol/L 3.6  4.0  4.0   Chloride 98 - 111 mmol/L 97  99  97   CO2 22 - 32 mmol/L 28  30  27    Calcium 8.9 - 10.3 mg/dL 8.8  9.0  8.7   Total Protein 6.5 - 8.1 g/dL 6.6  6.8  6.7   Total Bilirubin 0.3 - 1.2 mg/dL 0.5  0.4  0.6   Alkaline Phos 38 - 126 U/L 49  51  43   AST 15 - 41 U/L 9  14  13    ALT 0 - 44 U/L 22  19  16      Latest Reference Range & Units 11/27/23 09:13  Iron 28 - 170 ug/dL 64  UIBC ug/dL 782  TIBC 956 - 213 ug/dL 086  Saturation Ratios 10.4 - 31.8 % 16  Ferritin 11 - 307  ng/mL 72    ASSESSMENT & PLAN:  Assessment/Plan:  A 38 y.o. female with elevated platelets secondary, with a component related to iron deficiency anemia.  When evaluating her labs today, her platelets are higher than previously.  However, they remain less than 600.  Collectively, her iron parameters are normal and are not consistent with her being iron deficient.  Clinically, she is doing well.  I will see her back in 6 months for repeat clinical assessment.  The patient understands all the plans discussed today and is in agreement with them.    Tequlia Gonsalves Kirby Funk, MD

## 2023-11-29 LAB — SOLUBLE TRANSFERRIN RECEPTOR: Transferrin Receptor: 20.5 nmol/L (ref 12.2–27.3)

## 2024-05-27 ENCOUNTER — Other Ambulatory Visit: Payer: Commercial Managed Care - PPO

## 2024-05-27 ENCOUNTER — Ambulatory Visit: Payer: Commercial Managed Care - PPO | Admitting: Oncology
